# Patient Record
Sex: Female | Born: 2000 | Race: White | Hispanic: No | Marital: Single | Smoking: Never smoker
Health system: Southern US, Community
[De-identification: ages and names within clinical notes are randomized; demographics above are authoritative.]

## PROBLEM LIST (undated history)

## (undated) DIAGNOSIS — I10 Essential (primary) hypertension: Secondary | ICD-10-CM

## (undated) DIAGNOSIS — E282 Polycystic ovarian syndrome: Secondary | ICD-10-CM

## (undated) DIAGNOSIS — G44229 Chronic tension-type headache, not intractable: Secondary | ICD-10-CM

## (undated) HISTORY — DX: Chronic tension-type headache, not intractable: G44.229

## (undated) HISTORY — PX: TONSILLECTOMY AND ADENOIDECTOMY: SUR1326

## (undated) HISTORY — DX: Essential (primary) hypertension: I10

## (undated) HISTORY — PX: MOUTH SURGERY: SHX715

## (undated) HISTORY — PX: LIPOMA RESECTION: SHX23

## (undated) HISTORY — DX: Polycystic ovarian syndrome: E28.2

---
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
## 2021-01-02 ENCOUNTER — Encounter: Payer: Self-pay | Admitting: Emergency Medicine

## 2021-01-02 ENCOUNTER — Emergency Department: Payer: 59

## 2021-01-02 ENCOUNTER — Other Ambulatory Visit: Payer: Self-pay

## 2021-01-02 ENCOUNTER — Emergency Department
Admission: EM | Admit: 2021-01-02 | Discharge: 2021-01-02 | Disposition: A | Discharge status: Home / Self Care | Payer: 59 | Attending: Emergency Medicine | Admitting: Emergency Medicine

## 2021-01-02 DIAGNOSIS — R110 Nausea: Secondary | ICD-10-CM | POA: Insufficient documentation

## 2021-01-02 DIAGNOSIS — R1032 Left lower quadrant pain: Secondary | ICD-10-CM | POA: Insufficient documentation

## 2021-01-02 DIAGNOSIS — R1012 Left upper quadrant pain: Secondary | ICD-10-CM | POA: Diagnosis present

## 2021-01-02 LAB — COMPREHENSIVE METABOLIC PANEL
ALT: 23 U/L (ref 0–44)
AST: 19 U/L (ref 15–41)
Albumin: 4.4 g/dL (ref 3.5–5.0)
Alkaline Phosphatase: 42 U/L (ref 38–126)
Anion gap: 6 (ref 5–15)
BUN: 10 mg/dL (ref 6–20)
CO2: 25 mmol/L (ref 22–32)
Calcium: 9.2 mg/dL (ref 8.9–10.3)
Chloride: 102 mmol/L (ref 98–111)
Creatinine, Ser: 0.65 mg/dL (ref 0.44–1.00)
GFR, Estimated: >60 mL/min (ref >60)
Glucose, Bld: 123 mg/dL — ABNORMAL HIGH (ref 70–99)
Potassium: 3.9 mmol/L (ref 3.5–5.1)
Sodium: 133 mmol/L — ABNORMAL LOW (ref 135–145)
Total Bilirubin: 0.7 mg/dL (ref 0.3–1.2)
Total Protein: 7.4 g/dL (ref 6.5–8.1)

## 2021-01-02 LAB — URINALYSIS, COMPLETE (UACMP) WITH MICROSCOPIC
Bacteria, UA: NONE SEEN
Bilirubin Urine: NEGATIVE
Glucose, UA: NEGATIVE mg/dL
Hgb urine dipstick: NEGATIVE
Ketones, ur: NEGATIVE mg/dL
Leukocytes,Ua: NEGATIVE
Nitrite: NEGATIVE
Protein, ur: NEGATIVE mg/dL
Specific Gravity, Urine: 1.021 (ref 1.005–1.030)
pH: 6 (ref 5.0–8.0)

## 2021-01-02 LAB — CBC
HCT: 41.5 % (ref 36.0–46.0)
Hemoglobin: 14.6 g/dL (ref 12.0–15.0)
MCH: 29.1 pg (ref 26.0–34.0)
MCHC: 35.2 g/dL (ref 30.0–36.0)
MCV: 82.8 fL (ref 80.0–100.0)
Platelets: 272 10*3/uL (ref 150–400)
RBC: 5.01 MIL/uL (ref 3.87–5.11)
RDW: 12.5 % (ref 11.5–15.5)
WBC: 7.4 10*3/uL (ref 4.0–10.5)
nRBC: 0 % (ref 0.0–0.2)

## 2021-01-02 LAB — LIPASE, BLOOD: Lipase: 29 U/L (ref 11–51)

## 2021-01-02 LAB — POC URINE PREG, ED: Preg Test, Ur: NEGATIVE

## 2021-01-02 MED ORDER — TRAMADOL HCL 50 MG PO TABS: 50.0000 mg | ORAL_TABLET | Freq: Four times a day (QID) | ORAL | 0 refills | Status: DC | PRN

## 2021-01-02 NOTE — ED Provider Notes (Signed)
[facility] Emergency Department Provider Note   ____________________________________________    I have reviewed the triage vital signs and the nursing notes.   HISTORY  Chief Complaint Abdominal Pain and Nausea     HPI Valerie Hayes is a 20 y.o. female who presents with complaints of abdominal pain and nausea.  Patient reports left upper quadrant abdominal pain that has been ongoing for approximately 24 to 48 hours.  She reports he went to urgent care today and when she gave a urine sample the pain seemed to move more suprapubically but now it is primarily in the left upper quadrant, she describes mild nausea.  She is noted significant odor to her urine over the last week as well.  No history of kidney stones.  Denies fever but occasional chills  History reviewed. No pertinent past medical history.  There are no problems to display for this patient.   History reviewed. No pertinent surgical history.  Prior to Admission medications   Medication Sig Start Date End Date Taking? Authorizing Provider  traMADol (ULTRAM) 50 MG tablet Take 1 tablet (50 mg total) by mouth every 6 (six) hours as needed. 01/02/21 01/02/22 Yes Jene Every, MD     Allergies Patient has no known allergies.  No family history on file.  Social History    Review of Systems  Constitutional: As above Eyes: No visual changes.  ENT: No sore throat. Cardiovascular: Denies chest pain. Respiratory: Denies shortness of breath. Gastrointestinal: As above Genitourinary: No dysuria Musculoskeletal: Negative for back pain. Skin: Negative for rash. Neurological: Negative for headaches or weakness   ____________________________________________   PHYSICAL EXAM:  VITAL SIGNS: ED Triage Vitals  Enc Vitals Group     BP 01/02/21 0956 128/90     Pulse Rate 01/02/21 0956 93     Resp 01/02/21 0956 17     Temp 01/02/21 0956 98.4 F (36.9 C)     Temp Source 01/02/21 0956  Oral     SpO2 01/02/21 0956 98 %     Weight 01/02/21 0956 93 kg (205 lb)     Height 01/02/21 0956 1.626 m (5\' 4" )     Head Circumference --      Peak Flow --      Pain Score 01/02/21 0956 8     Pain Loc --      Pain Edu? --      Excl. in GC? --     Constitutional: Alert and oriented. Eyes: Conjunctivae are normal.   Nose: No congestion/rhinnorhea.  Cardiovascular: Normal rate, regular rhythm. Grossly normal heart sounds.  Good peripheral circulation. Respiratory: Normal respiratory effort.  No retractions. Lungs CTAB. Gastrointestinal: Soft, mild left l upper quadrant abdominal tenderness no significant CVA tenderness  Musculoskeletal: No lower extremity tenderness nor edema.  Warm and well perfused Neurologic:  Normal speech and language. No gross focal neurologic deficits are appreciated.  Skin:  Skin is warm, dry and intact. No rash noted. Psychiatric: Mood and affect are normal. Speech and behavior are normal.  ____________________________________________   LABS (all labs ordered are listed, but only abnormal results are displayed)  Labs Reviewed  COMPREHENSIVE METABOLIC PANEL - Abnormal; Notable for the following components:      Result Value   Sodium 133 (*)    Glucose, Bld 123 (*)    All other components within normal limits  URINALYSIS, COMPLETE (UACMP) WITH MICROSCOPIC - Abnormal; Notable for the following components:   Color, Urine YELLOW (*)  APPearance CLEAR (*)    All other components within normal limits  LIPASE, BLOOD  CBC  POC URINE PREG, ED   ____________________________________________  EKG  None ____________________________________________  RADIOLOGY  CT renal stone study reviewed by me ____________________________________________   PROCEDURES  Procedure(s) performed: No  Procedures   Critical Care performed: No ____________________________________________   INITIAL IMPRESSION / ASSESSMENT AND PLAN / ED COURSE  Pertinent labs  & imaging results that were available during my care of the patient were reviewed by me and considered in my medical decision making (see chart for details).   Patient presents with complaints as noted above.  Dark foul-smelling urine and suprapubic discomfort suspicious for possible urinary tract infection, overall patient is well-appearing with normal vitals, lab work overall unremarkable.  Normal LFTs.  Normal white blood cell count.  Pending urinalysis, POCT pregnancy.  Urinalysis is unremarkable, sent for CT renal stone study to evaluate for ureterolithiasis/pyelonephritis  CT is reassuring.  Recommended ultrasound of the pelvis however patient declined as she notes that she had an ultrasound done 2 weeks ago which was unremarkable.  Will discharge home on analgesics, return precautions discussed    ____________________________________________   FINAL CLINICAL IMPRESSION(S) / ED DIAGNOSES  Final diagnoses:  Left lower quadrant abdominal pain        Note:  This document was prepared using Dragon voice recognition software and may include unintentional dictation errors.    Jene Every, MD 01/02/21 1435

## 2021-01-02 NOTE — ED Notes (Signed)
See triage note  Presents with some abd pain  States pain is mainly to LUQ   Pos nausea  No vomiting or fever

## 2021-01-02 NOTE — ED Triage Notes (Signed)
Pt reports yesterday started feeling nauseated and today started with left upper quad abd pain that is sharp in nature

## 2021-01-08 ENCOUNTER — Ambulatory Visit: Payer: 59 | Admitting: Surgery

## 2021-01-19 ENCOUNTER — Ambulatory Visit: Payer: Self-pay | Admitting: Cardiology

## 2021-06-02 ENCOUNTER — Telehealth: Payer: Self-pay | Admitting: Nurse Practitioner

## 2021-06-02 DIAGNOSIS — U071 COVID-19: Secondary | ICD-10-CM

## 2021-06-02 MED ORDER — MOLNUPIRAVIR EUA 200MG CAPSULE: 4.0000 | ORAL_CAPSULE | Freq: Two times a day (BID) | ORAL | 0 refills | Status: AC

## 2021-06-02 NOTE — Patient Instructions (Signed)
COVID-19: Quarantine and Isolation °Quarantine °If you were exposed °Quarantine and stay away from others when you have been in close contact with someone who has COVID-19. °Isolate °If you are sick or test positive °Isolate when you are sick or when you have COVID-19, even if you don't have symptoms. °When to stay home °Calculating quarantine °The date of your exposure is considered day 0. Day 1 is the first full day after your last contact with a person who has had COVID-19. Stay home and away from other people for at least 5 days. Learn why CDC updated guidance for the general public. °IF YOU were exposed to COVID-19 and are NOT  °up to dateIF YOU were exposed to COVID-19 and are NOT on COVID-19 vaccinations °Quarantine for at least 5 days °Stay home °Stay home and quarantine for at least 5 full days. °Wear a well-fitting mask if you must be around others in your home. °Do not travel. °Get tested °Even if you don't develop symptoms, get tested at least 5 days after you last had close contact with someone with COVID-19. °After quarantine °Watch for symptoms °Watch for symptoms until 10 days after you last had close contact with someone with COVID-19. °Avoid travel °It is best to avoid travel until a full 10 days after you last had close contact with someone with COVID-19. °If you develop symptoms °Isolate immediately and get tested. Continue to stay home until you know the results. Wear a well-fitting mask around others. °Take precautions until day 10 °Wear a well-fitting mask °Wear a well-fitting mask for 10 full days any time you are around others inside your home or in public. Do not go to places where you are unable to wear a well-fitting mask. °If you must travel during days 6-10, take precautions. °Avoid being around people who are more likely to get very sick from COVID-19. °IF YOU were exposed to COVID-19 and are  °up to dateIF YOU were exposed to COVID-19 and are on COVID-19 vaccinations °No  quarantine °You do not need to stay home unless you develop symptoms. °Get tested °Even if you don't develop symptoms, get tested at least 5 days after you last had close contact with someone with COVID-19. °Watch for symptoms °Watch for symptoms until 10 days after you last had close contact with someone with COVID-19. °If you develop symptoms °Isolate immediately and get tested. Continue to stay home until you know the results. Wear a well-fitting mask around others. °Take precautions until day 10 °Wear a well-fitting mask °Wear a well-fitting mask for 10 full days any time you are around others inside your home or in public. Do not go to places where you are unable to wear a well-fitting mask. °Take precautions if traveling °Avoid being around people who are more likely to get very sick from COVID-19. °IF YOU were exposed to COVID-19 and had confirmed COVID-19 within the past 90 days (you tested positive using a viral test) °No quarantine °You do not need to stay home unless you develop symptoms. °Watch for symptoms °Watch for symptoms until 10 days after you last had close contact with someone with COVID-19. °If you develop symptoms °Isolate immediately and get tested. Continue to stay home until you know the results. Wear a well-fitting mask around others. °Take precautions until day 10 °Wear a well-fitting mask °Wear a well-fitting mask for 10 full days any time you are around others inside your home or in public. Do not go to places where you are   unable to wear a well-fitting mask. °Take precautions if traveling °Avoid being around people who are more likely to get very sick from COVID-19. °Calculating isolation °Day 0 is your first day of symptoms or a positive viral test. Day 1 is the first full day after your symptoms developed or your test specimen was collected. If you have COVID-19 or have symptoms, isolate for at least 5 days. °IF YOU tested positive for COVID-19 or have symptoms, regardless of  vaccination status °Stay home for at least 5 days °Stay home for 5 days and isolate from others in your home. °Wear a well-fitting mask if you must be around others in your home. °Do not travel. °Ending isolation if you had symptoms °End isolation after 5 full days if you are fever-free for 24 hours (without the use of fever-reducing medication) and your symptoms are improving. °Ending isolation if you did NOT have symptoms °End isolation after at least 5 full days after your positive test. °If you got very sick from COVID-19 or have a weakened immune system °You should isolate for at least 10 days. Consult your doctor before ending isolation. °Take precautions until day 10 °Wear a well-fitting mask °Wear a well-fitting mask for 10 full days any time you are around others inside your home or in public. Do not go to places where you are unable to wear a well-fitting mask. °Do not travel °Do not travel until a full 10 days after your symptoms started or the date your positive test was taken if you had no symptoms. °Avoid being around people who are more likely to get very sick from COVID-19. °Definitions °Exposure °Contact with someone infected with SARS-CoV-2, the virus that causes COVID-19, in a way that increases the likelihood of getting infected with the virus. °Close contact °A close contact is someone who was less than 6 feet away from an infected person (laboratory-confirmed or a clinical diagnosis) for a cumulative total of 15 minutes or more over a 24-hour period. For example, three individual 5-minute exposures for a total of 15 minutes. People who are exposed to someone with COVID-19 after they completed at least 5 days of isolation are not considered close contacts. °Quarantine °Quarantine is a strategy used to prevent transmission of COVID-19 by keeping people who have been in close contact with someone with COVID-19 apart from others. °Who does not need to quarantine? °If you had close contact with  someone with COVID-19 and you are in one of the following groups, you do not need to quarantine. °You are up to date with your COVID-19 vaccines. °You had confirmed COVID-19 within the last 90 days (meaning you tested positive using a viral test). °If you are up to date with COVID-19 vaccines, you should wear a well-fitting mask around others for 10 days from the date of your last close contact with someone with COVID-19 (the date of last close contact is considered day 0). Get tested at least 5 days after you last had close contact with someone with COVID-19. If you test positive or develop COVID-19 symptoms, isolate from other people and follow recommendations in the Isolation section below. If you tested positive for COVID-19 with a viral test within the previous 90 days and subsequently recovered and remain without COVID-19 symptoms, you do not need to quarantine or get tested after close contact. You should wear a well-fitting mask around others for 10 days from the date of your last close contact with someone with COVID-19 (the date of last   close contact is considered day 0). If you have COVID-19 symptoms, get tested and isolate from other people and follow recommendations in the Isolation section below. °Who should quarantine? °If you come into close contact with someone with COVID-19, you should quarantine if you are not up to date on COVID-19 vaccines. This includes people who are not vaccinated. °What to do for quarantine °Stay home and away from other people for at least 5 days (day 0 through day 5) after your last contact with a person who has COVID-19. The date of your exposure is considered day 0. Wear a well-fitting mask when around others at home, if possible. °For 10 days after your last close contact with someone with COVID-19, watch for fever (100.4°F or greater), cough, shortness of breath, or other COVID-19 symptoms. °If you develop symptoms, get tested immediately and isolate until you receive  your test results. If you test positive, follow isolation recommendations. °If you do not develop symptoms, get tested at least 5 days after you last had close contact with someone with COVID-19. °If you test negative, you can leave your home, but continue to wear a well-fitting mask when around others at home and in public until 10 days after your last close contact with someone with COVID-19. °If you test positive, you should isolate for at least 5 days from the date of your positive test (if you do not have symptoms). If you do develop COVID-19 symptoms, isolate for at least 5 days from the date your symptoms began (the date the symptoms started is day 0). Follow recommendations in the isolation section below. °If you are unable to get a test 5 days after last close contact with someone with COVID-19, you can leave your home after day 5 if you have been without COVID-19 symptoms throughout the 5-day period. Wear a well-fitting mask for 10 days after your date of last close contact when around others at home and in public. °Avoid people who are have weakened immune systems or are more likely to get very sick from COVID-19, and nursing homes and other high-risk settings, until after at least 10 days. °If possible, stay away from people you live with, especially people who are at higher risk for getting very sick from COVID-19, as well as others outside your home throughout the full 10 days after your last close contact with someone with COVID-19. °If you are unable to quarantine, you should wear a well-fitting mask for 10 days when around others at home and in public. °If you are unable to wear a mask when around others, you should continue to quarantine for 10 days. Avoid people who have weakened immune systems or are more likely to get very sick from COVID-19, and nursing homes and other high-risk settings, until after at least 10 days. °See additional information about travel. °Do not go to places where you are  unable to wear a mask, such as restaurants and some gyms, and avoid eating around others at home and at work until after 10 days after your last close contact with someone with COVID-19. °After quarantine °Watch for symptoms until 10 days after your last close contact with someone with COVID-19. °If you have symptoms, isolate immediately and get tested. °Quarantine in high-risk congregate settings °In certain congregate settings that have high risk of secondary transmission (such as correctional and detention facilities, homeless shelters, or cruise ships), CDC recommends a 10-day quarantine for residents, regardless of vaccination and booster status. During periods of critical staffing   shortages, facilities may consider shortening the quarantine period for staff to ensure continuity of operations. Decisions to shorten quarantine in these settings should be made in consultation with state, local, tribal, or territorial health departments and should take into consideration the context and characteristics of the facility. CDC's setting-specific guidance provides additional recommendations for these settings. °Isolation °Isolation is used to separate people with confirmed or suspected COVID-19 from those without COVID-19. People who are in isolation should stay home until it's safe for them to be around others. At home, anyone sick or infected should separate from others, or wear a well-fitting mask when they need to be around others. People in isolation should stay in a specific "sick room" or area and use a separate bathroom if available. Everyone who has presumed or confirmed COVID-19 should stay home and isolate from other people for at least 5 full days (day 0 is the first day of symptoms or the date of the day of the positive viral test for asymptomatic persons). They should wear a mask when around others at home and in public for an additional 5 days. People who are confirmed to have COVID-19 or are showing  symptoms of COVID-19 need to isolate regardless of their vaccination status. This includes: °People who have a positive viral test for COVID-19, regardless of whether or not they have symptoms. °People with symptoms of COVID-19, including people who are awaiting test results or have not been tested. People with symptoms should isolate even if they do not know if they have been in close contact with someone with COVID-19. °What to do for isolation °Monitor your symptoms. If you have an emergency warning sign (including trouble breathing), seek emergency medical care immediately. °Stay in a separate room from other household members, if possible. °Use a separate bathroom, if possible. °Take steps to improve ventilation at home, if possible. °Avoid contact with other members of the household and pets. °Don't share personal household items, like cups, towels, and utensils. °Wear a well-fitting mask when you need to be around other people. °Learn more about what to do if you are sick and how to notify your contacts. °Ending isolation for people who had COVID-19 and had symptoms °If you had COVID-19 and had symptoms, isolate for at least 5 days. To calculate your 5-day isolation period, day 0 is your first day of symptoms. Day 1 is the first full day after your symptoms developed. You can leave isolation after 5 full days. °You can end isolation after 5 full days if you are fever-free for 24 hours without the use of fever-reducing medication and your other symptoms have improved (Loss of taste and smell may persist for weeks or months after recovery and need not delay the end of isolation). °You should continue to wear a well-fitting mask around others at home and in public for 5 additional days (day 6 through day 10) after the end of your 5-day isolation period. If you are unable to wear a mask when around others, you should continue to isolate for a full 10 days. Avoid people who have weakened immune systems or are more  likely to get very sick from COVID-19, and nursing homes and other high-risk settings, until after at least 10 days. °If you continue to have fever or your other symptoms have not improved after 5 days of isolation, you should wait to end your isolation until you are fever-free for 24 hours without the use of fever-reducing medication and your other symptoms have improved.   Continue to wear a well-fitting mask through day 10. Contact your healthcare provider if you have questions. °See additional information about travel. °Do not go to places where you are unable to wear a mask, such as restaurants and some gyms, and avoid eating around others at home and at work until a full 10 days after your first day of symptoms. °If an individual has access to a test and wants to test, the best approach is to use an antigen test1 towards the end of the 5-day isolation period. Collect the test sample only if you are fever-free for 24 hours without the use of fever-reducing medication and your other symptoms have improved (loss of taste and smell may persist for weeks or months after recovery and need not delay the end of isolation). If your test result is positive, you should continue to isolate until day 10. If your test result is negative, you can end isolation, but continue to wear a well-fitting mask around others at home and in public until day 10. Follow additional recommendations for masking and avoiding travel as described above. °1As noted in the labeling for authorized over-the counter antigen tests: Negative results should be treated as presumptive. Negative results do not rule out SARS-CoV-2 infection and should not be used as the sole basis for treatment or patient management decisions, including infection control decisions. To improve results, antigen tests should be used twice over a three-day period with at least 24 hours and no more than 48 hours between tests. °Note that these recommendations on ending isolation  do not apply to people who are moderately ill or very sick from COVID-19 or have weakened immune systems. See section below for recommendations for when to end isolation for these groups. °Ending isolation for people who tested positive for COVID-19 but had no symptoms °If you test positive for COVID-19 and never develop symptoms, isolate for at least 5 days. Day 0 is the day of your positive viral test (based on the date you were tested) and day 1 is the first full day after the specimen was collected for your positive test. You can leave isolation after 5 full days. °If you continue to have no symptoms, you can end isolation after at least 5 days. °You should continue to wear a well-fitting mask around others at home and in public until day 10 (day 6 through day 10). If you are unable to wear a mask when around others, you should continue to isolate for 10 days. Avoid people who have weakened immune systems or are more likely to get very sick from COVID-19, and nursing homes and other high-risk settings, until after at least 10 days. °If you develop symptoms after testing positive, your 5-day isolation period should start over. Day 0 is your first day of symptoms. Follow the recommendations above for ending isolation for people who had COVID-19 and had symptoms. °See additional information about travel. °Do not go to places where you are unable to wear a mask, such as restaurants and some gyms, and avoid eating around others at home and at work until 10 days after the day of your positive test. °If an individual has access to a test and wants to test, the best approach is to use an antigen test1 towards the end of the 5-day isolation period. If your test result is positive, you should continue to isolate until day 10. If your test result is positive, you can also choose to test daily and if your test result   is negative, you can end isolation, but continue to wear a well-fitting mask around others at home and in  public until day 10. Follow additional recommendations for masking and avoiding travel as described above. °1As noted in the labeling for authorized over-the counter antigen tests: Negative results should be treated as presumptive. Negative results do not rule out SARS-CoV-2 infection and should not be used as the sole basis for treatment or patient management decisions, including infection control decisions. To improve results, antigen tests should be used twice over a three-day period with at least 24 hours and no more than 48 hours between tests. °Ending isolation for people who were moderately or very sick from COVID-19 or have a weakened immune system °People who are moderately ill from COVID-19 (experiencing symptoms that affect the lungs like shortness of breath or difficulty breathing) should isolate for 10 days and follow all other isolation precautions. To calculate your 10-day isolation period, day 0 is your first day of symptoms. Day 1 is the first full day after your symptoms developed. If you are unsure if your symptoms are moderate, talk to a healthcare provider for further guidance. °People who are very sick from COVID-19 (this means people who were hospitalized or required intensive care or ventilation support) and people who have weakened immune systems might need to isolate at home longer. They may also require testing with a viral test to determine when they can be around others. CDC recommends an isolation period of at least 10 and up to 20 days for people who were very sick from COVID-19 and for people with weakened immune systems. Consult with your healthcare provider about when you can resume being around other people. If you are unsure if your symptoms are severe or if you have a weakened immune system, talk to a healthcare provider for further guidance. °People who have a weakened immune system should talk to their healthcare provider about the potential for reduced immune responses to  COVID-19 vaccines and the need to continue to follow current prevention measures (including wearing a well-fitting mask and avoiding crowds and poorly ventilated indoor spaces) to protect themselves against COVID-19 until advised otherwise by their healthcare provider. Close contacts of immunocompromised people--including household members--should also be encouraged to receive all recommended COVID-19 vaccine doses to help protect these people. °Isolation in high-risk congregate settings °In certain high-risk congregate settings that have high risk of secondary transmission and where it is not feasible to cohort people (such as correctional and detention facilities, homeless shelters, and cruise ships), CDC recommends a 10-day isolation period for residents. During periods of critical staffing shortages, facilities may consider shortening the isolation period for staff to ensure continuity of operations. Decisions to shorten isolation in these settings should be made in consultation with state, local, tribal, or territorial health departments and should take into consideration the context and characteristics of the facility. CDC's setting-specific guidance provides additional recommendations for these settings. °This CDC guidance is meant to supplement--not replace--any federal, state, local, territorial, or tribal health and safety laws, rules, and regulations. °Recommendations for specific settings °These recommendations do not apply to healthcare professionals. For guidance specific to these settings, see °Healthcare professionals: Interim Guidance for Managing Healthcare Personnel with SARS-CoV-2 Infection or Exposure to SARS-CoV-2 °Patients, residents, and visitors to healthcare settings: Interim Infection Prevention and Control Recommendations for Healthcare Personnel During the Coronavirus Disease 2019 (COVID-19) Pandemic °Additional setting-specific guidance and recommendations are available. °These  recommendations on quarantine and isolation do apply to K[employer] School   settings. Additional guidance is available here: Overview of COVID-19 Quarantine for K-12 Schools °Travelers: Travel information and recommendations °Congregate facilities and other settings: guidance pages for community, work, and school settings °Ongoing COVID-19 exposure FAQs °I live with someone with COVID-19, but I cannot be separated from them. How do we manage quarantine in this situation? °It is very important for people with COVID-19 to remain apart from other people, if possible, even if they are living together. If separation of the person with COVID-19 from others that they live with is not possible, the other people that they live with will have ongoing exposure, meaning they will be repeatedly exposed until that person is no longer able to spread the virus to other people. In this situation, there are precautions you can take to limit the spread of COVID-19: °The person with COVID-19 and everyone they live with should wear a well-fitting mask inside the home. °If possible, one person should care for the person with COVID-19 to limit the number of people who are in close contact with the infected person. °Take steps to protect yourself and others to reduce transmission in the home: °Quarantine if you are not up to date with your COVID-19 vaccines. °Isolate if you are sick or tested positive for COVID-19, even if you don't have symptoms. °Learn more about the public health recommendations for testing, mask use and quarantine of close contacts, like yourself, who have ongoing exposure. These recommendations differ depending on your vaccination status. °What should I do if I have ongoing exposure to COVID-19 from someone I live with? °Recommendations for this situation depend on your vaccination status: °If you are not up to date on COVID-19 vaccines and have ongoing exposure to COVID-19, you should: °Begin quarantine immediately and  continue to quarantine throughout the isolation period of the person with COVID-19. °Continue to quarantine for an additional 5 days starting the day after the end of isolation for the person with COVID-19. °Get tested at least 5 days after the end of isolation of the infected person that lives with them. °If you test negative, you can leave the home but should continue to wear a well-fitting mask when around others at home and in public until 10 days after the end of isolation for the person with COVID-19. °Isolate immediately if you develop symptoms of COVID-19 or test positive. °If you are up to date with COVID-19 vaccines and have ongoing exposure to COVID-19, you should: °Get tested at least 5 days after your first exposure. A person with COVID-19 is considered infectious starting 2 days before they develop symptoms, or 2 days before the date of their positive test if they do not have symptoms. °Get tested again at least 5 days after the end of isolation for the person with COVID-19. °Wear a well-fitting mask when you are around the person with COVID-19, and do this throughout their isolation period. °Wear a well-fitting mask around others for 10 days after the infected person's isolation period ends. °Isolate immediately if you develop symptoms of COVID-19 or test positive. °What should I do if multiple people I live with test positive for COVID-19 at different times? °Recommendations for this situation depend on your vaccination status: °If you are not up to date with your COVID-19 vaccines, you should: °Quarantine throughout the isolation period of any infected person that you live with. °Continue to quarantine until 5 days after the end of isolation date for the most recently infected person that lives with you. For example, if   the last day of isolation of the person most recently infected with COVID-19 was June 30, the new 5-day quarantine period starts on July 1. °Get tested at least 5 days after the end  of isolation for the most recently infected person that lives with you. °Wear a well-fitting mask when you are around any person with COVID-19 while that person is in isolation. °Wear a well-fitting mask when you are around other people until 10 days after your last close contact. °Isolate immediately if you develop symptoms of COVID-19 or test positive. °If you are up to date with your COVID-19 vaccines, you should: °Get tested at least 5 days after your first exposure. A person with COVID-19 is considered infectious starting 2 days before they developed symptoms, or 2 days before the date of their positive test if they do not have symptoms. °Get tested again at least 5 days after the end of isolation for the most recently infected person that lives with you. °Wear a well-fitting mask when you are around any person with COVID-19 while that person is in isolation. °Wear a well-fitting mask around others for 10 days after the end of isolation for the most recently infected person that lives with you. For example, if the last day of isolation for the person most recently infected with COVID-19 was June 30, the new 10-day period to wear a well-fitting mask indoors in public starts on July 1. °Isolate immediately if you develop symptoms of COVID-19 or test positive. °I had COVID-19 and completed isolation. Do I have to quarantine or get tested if someone I live with gets COVID-19 shortly after I completed isolation? °No. If you recently completed isolation and someone that lives with you tests positive for the virus that causes COVID-19 shortly after the end of your isolation period, you do not have to quarantine or get tested as long as you do not develop new symptoms. Once all of the people that live together have completed isolation or quarantine, refer to the guidance below for new exposures to COVID-19. °If you had COVID-19 in the previous 90 days and then came into close contact with someone with COVID-19, you do  not have to quarantine or get tested if you do not have symptoms. But you should: °Wear a well-fitting mask indoors in public for 10 days after your last close contact. °Monitor for COVID-19 symptoms for 10 days from the date of your last close contact. °Isolate immediately and get tested if symptoms develop. °If more than 90 days have passed since your recovery from infection, follow CDC's recommendations for close contacts. These recommendations will differ depending on your vaccination status. °08/16/2020 °Content source: National Center for Immunization and Respiratory Diseases (NCIRD), Division of Viral Diseases °This information is not intended to replace advice given to you by your health care provider. Make sure you discuss any questions you have with your health care provider. °Document Revised: 12/20/2020 Document Reviewed: 12/20/2020 °Elsevier Patient Education © 2022 [employer] Inc[employer] ° °

## 2021-06-02 NOTE — Progress Notes (Signed)
Virtual Visit Consent   Valerie Hayes, you are scheduled for a virtual visit with Mary-Margaret Daphine Deutscher, FNP, a [facility] provider, today.     Just as with appointments in the office, your consent must be obtained to participate.  Your consent will be active for this visit and any virtual visit you may have with one of our providers in the next 365 days.     If you have a MyChart account, a copy of this consent can be sent to you electronically.  All virtual visits are billed to your insurance company just like a traditional visit in the office.    As this is a virtual visit, video technology does not allow for your provider to perform a traditional examination.  This may limit your provider's ability to fully assess your condition.  If your provider identifies any concerns that need to be evaluated in person or the need to arrange testing (such as labs, EKG, etc.), we will make arrangements to do so.     Although advances in technology are sophisticated, we cannot ensure that it will always work on either your end or our end.  If the connection with a video visit is poor, the visit may have to be switched to a telephone visit.  With either a video or telephone visit, we are not always able to ensure that we have a secure connection.     I need to obtain your verbal consent now.   Are you willing to proceed with your visit today? YES   Valerie Hayes has provided verbal consent on 06/02/2021 for a virtual visit (video or telephone).   Mary-Margaret Daphine Deutscher, FNP   Date: 06/02/2021 9:09 AM   Virtual Visit via Video Note   I, Mary-Margaret Daphine Deutscher, connected with Valerie Hayes ([id number], September 12, 2000) on 06/02/21 at  9:15 AM EST by a video-enabled telemedicine application and verified that I am speaking with the correct person using two identifiers.  Location: Patient: Virtual Visit Location Patient: Home Provider: Virtual Visit Location Provider: Mobile   I discussed the  limitations of evaluation and management by telemedicine and the availability of in person appointments. The patient expressed understanding and agreed to proceed.    History of Present Illness: Valerie Hayes is a 21 y.o. who identifies as a female who was assigned female at birth, and is being seen today for covid positive.  HPI: URI  This is a new problem. The current episode started in the past 7 days. The problem has been gradually worsening. The maximum temperature recorded prior to her arrival was 100.4 - 100.9 F. The fever has been present for 1 to 2 days. Associated symptoms include congestion, coughing, headaches, rhinorrhea and a sore throat. Pertinent negatives include no chest pain. She has tried decongestant for the symptoms. The treatment provided mild relief.  Tested positive for covid this morning. Review of Systems  HENT:  Positive for congestion, rhinorrhea and sore throat.   Respiratory:  Positive for cough.   Cardiovascular:  Negative for chest pain.  Neurological:  Positive for headaches.   Problems: There are no problems to display for this patient.   Allergies: No Known Allergies Medications:  Current Outpatient Medications:    traMADol (ULTRAM) 50 MG tablet, Take 1 tablet (50 mg total) by mouth every 6 (six) hours as needed., Disp: 20 tablet, Rfl: 0  Observations/Objective: Patient is well-developed, well-nourished in no acute distress.  Resting comfortably  at home.  Head is normocephalic,  atraumatic.  No labored breathing.  Speech is clear and coherent with logical content.  Patient is alert and oriented at baseline.  Raspy voice  Assessment and Plan:  Valerie Hayes in today with chief complaint of Covid Positive   1. Lab test positive for detection of COVID-19 virus 1. Take meds as prescribed 2. Use a cool mist humidifier especially during the winter months and when heat has been humid. 3. Use saline nose sprays frequently 4. Saline irrigations  of the nose can be very helpful if done frequently.  * 4X daily for 1 week*  * Use of a nettie pot can be helpful with this. Follow directions with this* 5. Drink plenty of fluids 6. Keep thermostat turn down low 7.For any cough or congestion- delsym or mucinex 8. For fever or aces or pains- take tylenol or ibuprofen appropriate for age and weight.  * for fevers greater than 101 orally you may alternate ibuprofen and tylenol every  3 hours.    Meds ordered this encounter  Medications   molnupiravir EUA (LAGEVRIO) 200 mg CAPS capsule    Sig: Take 4 capsules (800 mg total) by mouth 2 (two) times daily for 5 days.    Dispense:  40 capsule    Refill:  0    Order Specific Question:   Supervising Provider    Answer:   Eber Hong [[id number]]        Follow Up Instructions: I discussed the assessment and treatment plan with the patient. The patient was provided an opportunity to ask questions and all were answered. The patient agreed with the plan and demonstrated an understanding of the instructions.  A copy of instructions were sent to the patient via MyChart.  The patient was advised to call back or seek an in-person evaluation if the symptoms worsen or if the condition fails to improve as anticipated.  Time:  I spent 8 minutes with the patient via telehealth technology discussing the above problems/concerns.    Mary-Margaret Daphine Deutscher, FNP

## 2021-06-03 ENCOUNTER — Telehealth: Payer: Self-pay | Admitting: Emergency Medicine

## 2021-06-03 DIAGNOSIS — J069 Acute upper respiratory infection, unspecified: Secondary | ICD-10-CM

## 2021-06-03 MED ORDER — PREDNISONE 20 MG PO TABS: 20.0000 mg | ORAL_TABLET | Freq: Two times a day (BID) | ORAL | 0 refills | Status: AC

## 2021-06-03 MED ORDER — LEVOCETIRIZINE DIHYDROCHLORIDE 5 MG PO TABS: 5.0000 mg | ORAL_TABLET | Freq: Every evening | ORAL | 0 refills | Status: DC

## 2021-06-03 NOTE — Progress Notes (Signed)
Virtual Visit Consent   Valerie Hayes, you are scheduled for a virtual visit with a [facility] provider today.     Just as with appointments in the office, your consent must be obtained to participate.  Your consent will be active for this visit and any virtual visit you may have with one of our providers in the next 365 days.     If you have a MyChart account, a copy of this consent can be sent to you electronically.  All virtual visits are billed to your insurance company just like a traditional visit in the office.    As this is a virtual visit, video technology does not allow for your provider to perform a traditional examination.  This may limit your provider's ability to fully assess your condition.  If your provider identifies any concerns that need to be evaluated in person or the need to arrange testing (such as labs, EKG, etc.), we will make arrangements to do so.     Although advances in technology are sophisticated, we cannot ensure that it will always work on either your end or our end.  If the connection with a video visit is poor, the visit may have to be switched to a telephone visit.  With either a video or telephone visit, we are not always able to ensure that we have a secure connection.     I need to obtain your verbal consent now.   Are you willing to proceed with your visit today? Yes   MARKALA SITTS has provided verbal consent on 06/03/2021 for a virtual visit (video or telephone).   Rennis Harding, [place]   Date: 06/03/2021 10:07 AM   Virtual Visit via Video Note   I, Rennis Harding, connected with  CATHARINA PICA  ([id number], May 01, 2001) on 06/03/21 at 10:00 AM EST by a video-enabled telemedicine application and verified that I am speaking with the correct person using two identifiers.  Location: Patient: Virtual Visit Location Patient: Home Provider: Virtual Visit Location Provider: Home Office   I discussed the limitations of evaluation and management  by telemedicine and the availability of in person appointments. The patient expressed understanding and agreed to proceed.    History of Present Illness: Valerie Hayes is a 21 y.o. who identifies as a female who was assigned female at birth, and is being seen today for sinus congestion with green drainage x 2 days  Reports covid and flu exposure at work.  Had negative covid test.  Has tried OTC medications with minimal relief.  Denies aggravating factors.  Reports previous symptoms in the past with sinus infection.   Complains of associated fatigue and body aches.  Denies fever, SOB, wheezing, chest pain, nausea, changes in bowel or bladder habits.    ROS: As per HPI.  All other pertinent ROS negative.     HPI: HPI  Problems: There are no problems to display for this patient.   Allergies: No Known Allergies Medications:  Current Outpatient Medications:    levocetirizine (XYZAL ALLERGY 24HR) 5 MG tablet, Take 1 tablet (5 mg total) by mouth every evening., Disp: 30 tablet, Rfl: 0   predniSONE (DELTASONE) 20 MG tablet, Take 1 tablet (20 mg total) by mouth 2 (two) times daily with a meal for 5 days., Disp: 10 tablet, Rfl: 0   molnupiravir EUA (LAGEVRIO) 200 mg CAPS capsule, Take 4 capsules (800 mg total) by mouth 2 (two) times daily for 5 days., Disp: 40 capsule, Rfl: 0  traMADol (ULTRAM) 50 MG tablet, Take 1 tablet (50 mg total) by mouth every 6 (six) hours as needed., Disp: 20 tablet, Rfl: 0  Observations/Objective: Patient is well-developed, well-nourished in no acute distress.  Resting comfortably at home. Appears fatigued but nontoxic Head is normocephalic, atraumatic.  No labored breathing. Speaking in full sentences and tolerating own secretions Speech is clear and coherent with logical content.  Patient is alert and oriented at baseline.   Assessment and Plan: 1. Viral URI with cough   Symptoms sound viral Get plenty of rest and push fluids Prednisone prescribed.  Take as  directed  Prescribed xyzal  for nasal congestion, runny nose, and/or sore throat Use flonase for nasal congestion and runny nose Use medications daily for symptom relief Use OTC medications like ibuprofen or tylenol as needed fever or pain Follow up with PCP as needed Follow up in person at urgent care or the ED if you have any new or worsening symptoms such as fever, worsening cough, shortness of breath, chest tightness, chest pain, turning blue, changes in mental status, etc...    Follow Up Instructions: I discussed the assessment and treatment plan with the patient. The patient was provided an opportunity to ask questions and all were answered. The patient agreed with the plan and demonstrated an understanding of the instructions.  A copy of instructions were sent to the patient via MyChart unless otherwise noted below.   The patient was advised to call back or seek an in-person evaluation if the symptoms worsen or if the condition fails to improve as anticipated.  Time:  I spent 5-10 minutes with the patient via telehealth technology discussing the above problems/concerns.    Rennis Harding, PA-C

## 2021-06-03 NOTE — Patient Instructions (Signed)
°  Valerie Hayes, thank you for joining Rennis Harding, PA-C for today's virtual visit.  While this provider is not your primary care provider (PCP), if your PCP is located in our provider database this encounter information will be shared with them immediately following your visit.  Consent: (Patient) Valerie Hayes provided verbal consent for this virtual visit at the beginning of the encounter.  Current Medications:  Current Outpatient Medications:    levocetirizine (XYZAL ALLERGY 24HR) 5 MG tablet, Take 1 tablet (5 mg total) by mouth every evening., Disp: 30 tablet, Rfl: 0   predniSONE (DELTASONE) 20 MG tablet, Take 1 tablet (20 mg total) by mouth 2 (two) times daily with a meal for 5 days., Disp: 10 tablet, Rfl: 0   molnupiravir EUA (LAGEVRIO) 200 mg CAPS capsule, Take 4 capsules (800 mg total) by mouth 2 (two) times daily for 5 days., Disp: 40 capsule, Rfl: 0   traMADol (ULTRAM) 50 MG tablet, Take 1 tablet (50 mg total) by mouth every 6 (six) hours as needed., Disp: 20 tablet, Rfl: 0   Medications ordered in this encounter:  Meds ordered this encounter  Medications   predniSONE (DELTASONE) 20 MG tablet    Sig: Take 1 tablet (20 mg total) by mouth 2 (two) times daily with a meal for 5 days.    Dispense:  10 tablet    Refill:  0    Order Specific Question:   Supervising Provider    Answer:   Hyacinth Meeker, BRIAN [[id number]]   levocetirizine (XYZAL ALLERGY 24HR) 5 MG tablet    Sig: Take 1 tablet (5 mg total) by mouth every evening.    Dispense:  30 tablet    Refill:  0    Order Specific Question:   Supervising Provider    Answer:   Hyacinth Meeker, BRIAN [[id number]]     *If you need refills on other medications prior to your next appointment, please contact your pharmacy*  Follow-Up: Call back or seek an in-person evaluation if the symptoms worsen or if the condition fails to improve as anticipated.  Other Instructions Symptoms sound viral Get plenty of rest and push fluids Prednisone  prescribed.  Take as directed  Prescribed xyzal  for nasal congestion, runny nose, and/or sore throat Use flonase for nasal congestion and runny nose Use medications daily for symptom relief Use OTC medications like ibuprofen or tylenol as needed fever or pain Follow up with PCP as needed Follow up in person at urgent care or the ED if you have any new or worsening symptoms such as fever, worsening cough, shortness of breath, chest tightness, chest pain, turning blue, changes in mental status, etc...     If you have been instructed to have an in-person evaluation today at a local Urgent Care facility, please use the link below. It will take you to a list of all of our available [facility], including address, phone number and hours of operation. Please do not delay care.  [facility]  If you or a family member do not have a primary care provider, use the link below to schedule a visit and establish care. When you choose a [facility] primary care physician or advanced practice provider, you gain a long-term partner in health. Find a Primary Care Provider  Learn more about [facility]'s in-office and virtual care options: [facility] - Get Care Now

## 2021-07-30 ENCOUNTER — Encounter: Payer: Self-pay | Admitting: Family

## 2021-07-30 ENCOUNTER — Other Ambulatory Visit: Payer: Self-pay

## 2021-07-30 ENCOUNTER — Ambulatory Visit (INDEPENDENT_AMBULATORY_CARE_PROVIDER_SITE_OTHER): Payer: Self-pay | Admitting: Family

## 2021-07-30 VITALS — BP 118/64 | HR 89 | Ht 63.0 in | Wt 197.0 lb

## 2021-07-30 DIAGNOSIS — M62838 Other muscle spasm: Secondary | ICD-10-CM | POA: Insufficient documentation

## 2021-07-30 DIAGNOSIS — E282 Polycystic ovarian syndrome: Secondary | ICD-10-CM | POA: Insufficient documentation

## 2021-07-30 DIAGNOSIS — N926 Irregular menstruation, unspecified: Secondary | ICD-10-CM

## 2021-07-30 DIAGNOSIS — R5383 Other fatigue: Secondary | ICD-10-CM

## 2021-07-30 DIAGNOSIS — F418 Other specified anxiety disorders: Secondary | ICD-10-CM

## 2021-07-30 DIAGNOSIS — G43109 Migraine with aura, not intractable, without status migrainosus: Secondary | ICD-10-CM

## 2021-07-30 DIAGNOSIS — F5105 Insomnia due to other mental disorder: Secondary | ICD-10-CM

## 2021-07-30 LAB — POCT URINE PREGNANCY: Preg Test, Ur: NEGATIVE

## 2021-07-30 MED ORDER — METHOCARBAMOL 500 MG PO TABS: ORAL_TABLET | ORAL | 0 refills | Status: DC

## 2021-07-30 MED ORDER — SERTRALINE HCL 50 MG PO TABS: 50.0000 mg | ORAL_TABLET | Freq: Every day | ORAL | 2 refills | Status: DC

## 2021-07-30 NOTE — Patient Instructions (Signed)
Start sertraline (Zoloft) 50 mg for anxiety and depression. Take 1/2 tablet by mouth once daily for about one week, then increase to 1 full tablet thereafter.  ? ?Taking the medicine as directed and not missing any doses is one of the best things you can do to treat youranxiety/depression.  Here are some things to keep in mind: ? ?Side effects (stomach upset, some increased anxiety) may happen before you notice a benefit.  These side effects typically go away over time. ?Changes to your dose of medicine or a change in medication all together is sometimes necessary ?Many people will notice an improvement within two weeks but the full effect of the medication can take up to 4-6 weeks ?Stopping the medication when you start feeling better often results in a return of symptoms. Most people need to be on medication at least 6-12 months ?If you start having thoughts of hurting yourself or others after starting this medicine, please call me immediately.   ? ?Stop by the lab prior to leaving today. I will notify you of your results once received.  ? ?It was a pleasure seeing you today! Please do not hesitate to reach out with any questions and or concerns. ? ?Regards,  ? ?Shemicka Cohrs ?FNP-C ? ?

## 2021-07-30 NOTE — Assessment & Plan Note (Signed)
R/o pregnancy with poct hcg ?

## 2021-07-30 NOTE — Assessment & Plan Note (Addendum)
D/w pt options however pt possibly trying to conceive. We will obtain labs to include tsh, cbc, cmp and sed rate/crp. Pending results. Thought is to help anxiety/depression to see if improvement in migraines. Increase water intake. Keep headache diary, try to avoid/identify triggers.  ? ?If no improvement consider higher dose topamax, and or referral neurology ?

## 2021-07-30 NOTE — Assessment & Plan Note (Signed)
tsh cbc cmp ordered pending results ?

## 2021-07-30 NOTE — Progress Notes (Signed)
New Patient Office Visit  Subjective:  Patient ID: Valerie Hayes, female    DOB: [birth year]  Age: 21 y.o. MRN: [id number]  CC:  Chief Complaint  Patient presents with   Migraine    Pt stated --had migraine and lasted for 1 day.   Anxiety    HPI Valerie Hayes is here to establish care as a new patient.  Prior provider was: last saw pediatrician back in 2017.  Pt is without acute concerns.   chronic concerns:  Headaches in the past: was taking topamax 25 mg, didn't really help her.  MRI 03/01/2017, FINDINGS:   Susceptibility artifact from dental braces limits evaluation of the anterior brain, face, and skull base.There is no focal parenchymal signal abnormality. There is no midline shift or mass lesion. There is no evidence of intracranial hemorrhage or acute infarct.  No diffusion weighted signal abnormality is identified.  Valerie Hayes has tried several OTC pain medications for her headaches: the most effective has been Excedrin migraine. She has also had some relief with ibuprofen 600 mg. She previously saw my colleague, Dr. Eula Listen, and had a trial of topiramate 25 mg. Valerie Hayes took 25 mg once a day but noted no significant change in her symptoms. I noted that he recommended titrating up to a goal of 50 mg BID but this does not seem to have happened. Valerie Hayes has also tried Maxalt, [facility], [facility], and a medrol dose pack for headaches, none of which seemed to be effective for her.   States migraines are frequent, about 2-3 times weekly. Increased stress, just bought a house. 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 When they come on feels tinging in arm, usually always on left side. Some nausea. No vomiting. No diarrhea. Light sensitive , not audio sensitive. No loss of movement or limb function. Right now tries excedrin migraine but only touches it slightly. Lasts about one day at a time, has to sleep it off. She is trying to drink a lot of water and  cut out sodas completely.   PCOS: irregularly irregular. Last1/22/23. Not sure if any chance of pregnancy. Painful heavy periods when they do occur, two weeks at a time. Pad and tampon both in one hour. No longer on birth control.   Anxiety: increased stress, on edge being told she snaps a lot more often than usual. Having issues falling asleep because hard to turn her brain off. Also with some depressive thoughts. Denies SI HI . Dad is taking zoloft as well.   GAD 7 : Generalized Anxiety Score 07/30/2021  Nervous, Anxious, on Edge 2  Control/stop worrying 3  Worry too much - different things 3  Trouble relaxing 2  Restless 2  Easily annoyed or irritable 3  Afraid - awful might happen 1  Total GAD 7 Score 16  Anxiety Difficulty Very difficult    PHQ9 SCORE ONLY 07/30/2021  PHQ-9 Total Score 19     History reviewed. No pertinent past medical history.  Past Surgical History:  Procedure Laterality Date   LIPOMA RESECTION     MOUTH SURGERY     expose a teeth that would'nt come down   TONSILLECTOMY AND ADENOIDECTOMY      No family history on file.  Social History   Socioeconomic History   Marital status: Single    Spouse name: Not on file   Number of children: Not on file   Years of education: Not on file  Highest education level: Not on file  Occupational History   Occupation: nursing student  Tobacco Use   Smoking status: Never   Smokeless tobacco: Never  Vaping Use   Vaping Use: Not on file  Substance and Sexual Activity   Alcohol use: Never   Drug use: Never   Sexual activity: Yes    Partners: Male    Birth control/protection: Coitus interruptus  Other Topics Concern   Not on file  Social History Narrative   Not on file   Social Determinants of Health   Financial Resource Strain: Not on file  Food Insecurity: Not on file  Transportation Needs: Not on file  Physical Activity: Not on file  Stress: Not on file  Social Connections: Not on file  Intimate  Partner Violence: Not on file    Outpatient Medications Prior to Visit  Medication Sig Dispense Refill   levocetirizine (XYZAL ALLERGY 24HR) 5 MG tablet Take 1 tablet (5 mg total) by mouth every evening. 30 tablet 0   traMADol (ULTRAM) 50 MG tablet Take 1 tablet (50 mg total) by mouth every 6 (six) hours as needed. 20 tablet 0   No facility-administered medications prior to visit.    Allergies  Allergen Reactions   Fexofenadine-Pseudoephed Er     ROS Review of Systems  Constitutional:  Negative for chills, fatigue, fever and unexpected weight change.  HENT:         Hear on chin and side of face  Eyes:  Negative for visual disturbance.  Respiratory:  Negative for shortness of breath.   Cardiovascular:  Negative for chest pain.  Gastrointestinal:  Negative for abdominal pain.  Genitourinary:  Positive for menstrual problem. Negative for difficulty urinating.  Skin:  Negative for rash.  Neurological:  Negative for dizziness and headaches.  Psychiatric/Behavioral:  Positive for agitation and sleep disturbance. Negative for behavioral problems, confusion, dysphoric mood and suicidal ideas. The patient is nervous/anxious.       Objective:    Physical Exam Vitals reviewed.  Constitutional:      General: She is not in acute distress.    Appearance: Normal appearance. She is obese. She is not ill-appearing, toxic-appearing or diaphoretic.  HENT:     Right Ear: Tympanic membrane normal.     Left Ear: Tympanic membrane normal.     Mouth/Throat:     Mouth: Mucous membranes are moist.     Pharynx: No pharyngeal swelling.     Tonsils: No tonsillar exudate.  Eyes:     Extraocular Movements: Extraocular movements intact.     Conjunctiva/sclera: Conjunctivae normal.     Pupils: Pupils are equal, round, and reactive to light.  Neck:     Thyroid: No thyroid mass.   Cardiovascular:     Rate and Rhythm: Normal rate and regular rhythm.  Pulmonary:     Effort: Pulmonary effort is  normal.     Breath sounds: Normal breath sounds.  Abdominal:     General: Abdomen is flat. Bowel sounds are normal.     Palpations: Abdomen is soft.  Musculoskeletal:        General: Normal range of motion.  Lymphadenopathy:     Cervical:     Right cervical: No superficial cervical adenopathy.    Left cervical: No superficial cervical adenopathy.  Skin:    General: Skin is warm.     Capillary Refill: Capillary refill takes less than 2 seconds.  Neurological:     General: No focal deficit present.  Mental Status: She is alert and oriented to person, place, and time.  Psychiatric:        Mood and Affect: Mood normal.        Behavior: Behavior normal.        Thought Content: Thought content normal.        Judgment: Judgment normal.      BP 118/64    Pulse 89    Ht  (1.6 m)    Wt 197 lb (89.4 kg)    LMP 06/10/2021    SpO2 98%    BMI 34.90 kg/m  Wt Readings from Last 3 Encounters:  07/30/21 197 lb (89.4 kg)  01/02/21 205 lb (93 kg) (98 %, Z= 2.02)*  11/21/20 200 lb 6.4 oz (90.9 kg) (97 %, Z= 1.95)*   * Growth percentiles are based on CDC (Girls, 2-20 Years) data.     Health Maintenance Due  Topic Date Due   COVID-19 Vaccine (1) Never done   CHLAMYDIA SCREENING  Never done   HIV Screening  Never done   Hepatitis C Screening  Never done   INFLUENZA VACCINE  12/18/2020    There are no preventive care reminders to display for this patient.  Lab Results  Component Value Date   TSH 0.739 11/21/2020   Lab Results  Component Value Date   WBC 7.4 01/02/2021   HGB 14.6 01/02/2021   HCT 41.5 01/02/2021   MCV 82.8 01/02/2021   PLT 272 01/02/2021   Lab Results  Component Value Date   NA 133 (L) 01/02/2021   K 3.9 01/02/2021   CO2 25 01/02/2021   GLUCOSE 123 (H) 01/02/2021   BUN 10 01/02/2021   CREATININE 0.65 01/02/2021   BILITOT 0.7 01/02/2021   ALKPHOS 42 01/02/2021   AST 19 01/02/2021   ALT 23 01/02/2021   PROT 7.4 01/02/2021   ALBUMIN 4.4 01/02/2021    CALCIUM 9.2 01/02/2021   ANIONGAP 6 01/02/2021   No results found for: CHOL No results found for: HDL No results found for: LDLCALC No results found for: TRIG No results found for: CHOLHDL Lab Results  Component Value Date   HGBA1C 5.5 11/21/2020      Assessment & Plan:   Problem List Items Addressed This Visit       Cardiovascular and Mediastinum   Migraine with aura and without status migrainosus, not intractable - Primary    D/w pt options however pt possibly trying to conceive. We will obtain labs to include tsh, cbc, cmp and sed rate/crp. Pending results. Thought is to help anxiety/depression to see if improvement in migraines. Increase water intake. Keep headache diary, try to avoid/identify triggers.   If no improvement consider higher dose topamax, and or referral neurology      Relevant Medications   sertraline (ZOLOFT) 50 MG tablet   methocarbamol (ROBAXIN) 500 MG tablet   Other Relevant Orders   TSH   CBC   Basic metabolic panel   Sedimentation rate   C-reactive protein     Endocrine   PCOS (polycystic ovarian syndrome)     Other   Insomnia secondary to depression with anxiety    Trial start zoloft.   I instructed pt to start 1/2 tablet once daily for 1 week and then increase to a full tablet once daily on week two as tolerated.  We discussed common side effects such as nausea, drowsiness and weight gain.  Also discussed rare but serious side effect of suicidal ideation.  She is instructed to discontinue medication and go directly to ED if this occurs.  Pt verbalizes understanding.  Plan is to follow up in 30 days to evaluate progress.          Relevant Medications   sertraline (ZOLOFT) 50 MG tablet   Missed period    R/o pregnancy with poct hcg      Relevant Orders   POCT urine pregnancy   Other fatigue    tsh cbc cmp ordered pending results      Relevant Orders   TSH   CBC   Basic metabolic panel   Sedimentation rate   C-reactive protein    Muscle spasm    Tightness of the lower back neck D/w pt posture, exercises, relaxation, applying heat to neck and muscle relaxer trial with ibuprofen prn.       Relevant Medications   methocarbamol (ROBAXIN) 500 MG tablet    Meds ordered this encounter  Medications   sertraline (ZOLOFT) 50 MG tablet    Sig: Take 1 tablet (50 mg total) by mouth daily.    Dispense:  30 tablet    Refill:  2    Order Specific Question:   Supervising Provider    Answer:   BEDSOLE, AMY E [[id number]]   methocarbamol (ROBAXIN) 500 MG tablet    Sig: Take one po qhs prn muscle spasm    Dispense:  20 tablet    Refill:  0    Order Specific Question:   Supervising Provider    Answer:   Ermalene SearingBEDSOLE, AMY E [[id number]]    Follow-up: Return in about 3 months (around 10/30/2021) for medication follow up.    Mort Sawyersabitha Tannah Dreyfuss, FNP

## 2021-07-30 NOTE — Assessment & Plan Note (Signed)
Tightness of the lower back neck ?D/w pt posture, exercises, relaxation, applying heat to neck and muscle relaxer trial with ibuprofen prn.  ?

## 2021-07-30 NOTE — Assessment & Plan Note (Signed)
Trial start zoloft.  ? I instructed pt to start 1/2 tablet once daily for 1 week and then increase to a full tablet once daily on week two as tolerated.  We discussed common side effects such as nausea, drowsiness and weight gain.  Also discussed rare but serious side effect of suicidal ideation.  She is instructed to discontinue medication and go directly to ED if this occurs.  Pt verbalizes understanding.  Plan is to follow up in 30 days to evaluate progress.   ? ? ?

## 2021-07-31 LAB — BASIC METABOLIC PANEL
BUN: 10 mg/dL (ref 6–23)
CO2: 27 mEq/L (ref 19–32)
Calcium: 9.6 mg/dL (ref 8.4–10.5)
Chloride: 104 mEq/L (ref 96–112)
Creatinine, Ser: 0.74 mg/dL (ref 0.40–1.20)
GFR: 116.73 mL/min (ref >60.00)
Glucose, Bld: 89 mg/dL (ref 70–99)
Potassium: 4.1 mEq/L (ref 3.5–5.1)
Sodium: 138 mEq/L (ref 135–145)

## 2021-07-31 LAB — SEDIMENTATION RATE: Sed Rate: 3 mm/hr (ref 0–20)

## 2021-07-31 LAB — CBC
HCT: 41.3 % (ref 36.0–46.0)
Hemoglobin: 14.4 g/dL (ref 12.0–15.0)
MCHC: 34.7 g/dL (ref 30.0–36.0)
MCV: 83.1 fl (ref 78.0–100.0)
Platelets: 312 10*3/uL (ref 150.0–400.0)
RBC: 4.97 Mil/uL (ref 3.87–5.11)
RDW: 12.9 % (ref 11.5–14.6)
WBC: 6.3 10*3/uL (ref 4.5–10.5)

## 2021-07-31 LAB — C-REACTIVE PROTEIN: CRP: <1 mg/dL (ref 0.5–20.0)

## 2021-07-31 LAB — TSH: TSH: 1.03 u[IU]/mL (ref 0.35–5.50)

## 2021-08-10 ENCOUNTER — Other Ambulatory Visit: Payer: Self-pay

## 2021-08-10 ENCOUNTER — Ambulatory Visit (LOCAL_COMMUNITY_HEALTH_CENTER): Payer: Self-pay

## 2021-08-10 DIAGNOSIS — Z111 Encounter for screening for respiratory tuberculosis: Secondary | ICD-10-CM

## 2021-08-13 ENCOUNTER — Ambulatory Visit (LOCAL_COMMUNITY_HEALTH_CENTER): Payer: Self-pay

## 2021-08-13 ENCOUNTER — Other Ambulatory Visit: Payer: Self-pay

## 2021-08-13 DIAGNOSIS — Z111 Encounter for screening for respiratory tuberculosis: Secondary | ICD-10-CM

## 2021-08-13 LAB — TB SKIN TEST
Induration: 0 mm
TB Skin Test: NEGATIVE

## 2021-09-05 ENCOUNTER — Telehealth: Payer: Self-pay | Admitting: Physician Assistant

## 2021-09-05 DIAGNOSIS — I160 Hypertensive urgency: Secondary | ICD-10-CM

## 2021-09-05 NOTE — Progress Notes (Signed)
?Virtual Visit Consent  ? ?Valerie Hayes, you are scheduled for a virtual visit with a [facility] provider today.   ?  ?Just as with appointments in the office, your consent must be obtained to participate.  Your consent will be active for this visit and any virtual visit you may have with one of our providers in the next 365 days.   ?  ?If you have a MyChart account, a copy of this consent can be sent to you electronically.  All virtual visits are billed to your insurance company just like a traditional visit in the office.   ? ?As this is a virtual visit, video technology does not allow for your provider to perform a traditional examination.  This may limit your provider's ability to fully assess your condition.  If your provider identifies any concerns that need to be evaluated in person or the need to arrange testing (such as labs, EKG, etc.), we will make arrangements to do so.   ?  ?Although advances in technology are sophisticated, we cannot ensure that it will always work on either your end or our end.  If the connection with a video visit is poor, the visit may have to be switched to a telephone visit.  With either a video or telephone visit, we are not always able to ensure that we have a secure connection.    ? ?Also, by engaging in this virtual visit, you consent to the provision of healthcare. Additionally, you authorize for your insurance to be billed (if applicable) for the services provided during this visit.  ? ?I need to obtain your verbal consent now.   Are you willing to proceed with your visit today?  ?  ?Valerie Hayes has provided verbal consent on 09/05/2021 for a virtual visit (video or telephone). ?  ?[name], PA-C  ? ?Date: 09/05/2021 5:37 PM ? ? ?Virtual Visit via Video Note  ? ?Valerie Hayes, connected with  Valerie Hayes  ([id number], 2001/03/25) on 09/05/21 at  5:30 PM EDT by a video-enabled telemedicine application and verified that I am speaking with the  correct person using two identifiers. ? ?Location: ?Patient: Virtual Visit Location Patient: Home ?Provider: Virtual Visit Location Provider: Home Office ?  ?I discussed the limitations of evaluation and management by telemedicine and the availability of in person appointments. The patient expressed understanding and agreed to proceed.   ? ?History of Present Illness: ?Valerie Hayes is a 21 y.o. who identifies as a female who was assigned female at birth, and is being seen today for HTN. 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 Later in the day she had her teacher recheck her BP and it was 160/110. She rechecked her BP once she was home and it was 170/120. She does have a headache and blurred vision associated. Denies chest pain, SOB, DOE, edema. Did see PCP one month ago and BP was normal (118/64). Labs (BMP, CBC, and TSH) all normal at that time as well.  ? ? ?Problems:  ?Patient Active Problem List  ? Diagnosis Date Noted  ? Migraine with aura and without status migrainosus, not intractable 07/30/2021  ? PCOS (polycystic ovarian syndrome) 07/30/2021  ? Insomnia secondary to depression with anxiety 07/30/2021  ? Missed period 07/30/2021  ? Other fatigue 07/30/2021  ? Muscle  spasm 07/30/2021  ?  ?Allergies:  ?Allergies  ?Allergen Reactions  ? Fexofenadine-Pseudoephed Er Nausea And Vomiting  ? ?Medications:  ?Current Outpatient Medications:  ?  methocarbamol (ROBAXIN) 500 MG tablet, Take one po qhs prn muscle spasm, Disp: 20 tablet, Rfl: 0 ?  sertraline (ZOLOFT) 50 MG tablet, Take 1 tablet (50 mg total) by mouth daily., Disp: 30 tablet, Rfl: 2 ? ?Observations/Objective: ?Patient is well-developed, well-nourished in no acute distress.  ?Resting comfortably at home.  ?Head is normocephalic, atraumatic.  ?No labored breathing.  ?Speech is clear and coherent with logical content.  ?Patient  is alert and oriented at baseline.  ? ? ?Assessment and Plan: ?1. Hypertensive urgency, malignant ? ?- Since this is an acute issue with associated symptoms I have advised her to proceed to closest ER; she voices understanding and agrees. ? ?Follow Up Instructions: ?I discussed the assessment and treatment plan with the patient. The patient was provided an opportunity to ask questions and all were answered. The patient agreed with the plan and demonstrated an understanding of the instructions.  A copy of instructions were sent to the patient via MyChart unless otherwise noted below.  ? ? ?The patient was advised to call back or seek an in-person evaluation if the symptoms worsen or if the condition fails to improve as anticipated. ? ?Time:  ?I spent 14 minutes with the patient via telehealth technology discussing the above problems/concerns.   ? ?[name], PA-C ?

## 2021-09-05 NOTE — Patient Instructions (Signed)
?  Valerie Hayes, thank you for joining Mar Daring, PA-C for today's virtual visit.  While this provider is not your primary care provider (PCP), if your PCP is located in our provider database this encounter information will be shared with them immediately following your visit. ? ?Consent: ?(Patient) Valerie Hayes provided verbal consent for this virtual visit at the beginning of the encounter. ? ?Current Medications: ? ?Current Outpatient Medications:  ?  methocarbamol (ROBAXIN) 500 MG tablet, Take one po qhs prn muscle spasm, Disp: 20 tablet, Rfl: 0 ?  sertraline (ZOLOFT) 50 MG tablet, Take 1 tablet (50 mg total) by mouth daily., Disp: 30 tablet, Rfl: 2  ? ?Medications ordered in this encounter:  ?No orders of the defined types were placed in this encounter. ?  ? ?*If you need refills on other medications prior to your next appointment, please contact your pharmacy* ? ?Follow-Up: ?Call back or seek an in-person evaluation if the symptoms worsen or if the condition fails to improve as anticipated. ? ?Other Instructions ?Proceed to ER ? ? ?If you have been instructed to have an in-person evaluation today at a local Urgent Care facility, please use the link below. It will take you to a list of all of our available [facility], including address, phone number and hours of operation. Please do not delay care.  ?[facility] ? ?If you or a family member do not have a primary care provider, use the link below to schedule a visit and establish care. When you choose a [facility] primary care physician or advanced practice provider, you gain a long-term partner in health. ?Find a Primary Care Provider ? ?Learn more about [facility] in-office and virtual care options: ?[facility] ?

## 2021-09-07 ENCOUNTER — Encounter: Payer: Self-pay | Admitting: Family

## 2021-09-07 ENCOUNTER — Ambulatory Visit (INDEPENDENT_AMBULATORY_CARE_PROVIDER_SITE_OTHER): Payer: Self-pay | Admitting: Family

## 2021-09-07 VITALS — BP 122/100 | HR 88 | Temp 98.2°F | Ht 63.0 in | Wt 199.6 lb

## 2021-09-07 DIAGNOSIS — J302 Other seasonal allergic rhinitis: Secondary | ICD-10-CM

## 2021-09-07 DIAGNOSIS — I10 Essential (primary) hypertension: Secondary | ICD-10-CM

## 2021-09-07 DIAGNOSIS — F411 Generalized anxiety disorder: Secondary | ICD-10-CM

## 2021-09-07 DIAGNOSIS — G44221 Chronic tension-type headache, intractable: Secondary | ICD-10-CM | POA: Insufficient documentation

## 2021-09-07 DIAGNOSIS — G43109 Migraine with aura, not intractable, without status migrainosus: Secondary | ICD-10-CM

## 2021-09-07 MED ORDER — LABETALOL HCL 100 MG PO TABS: 100.0000 mg | ORAL_TABLET | Freq: Two times a day (BID) | ORAL | 2 refills | Status: DC

## 2021-09-07 MED ORDER — SERTRALINE HCL 100 MG PO TABS
100.0000 mg | ORAL_TABLET | Freq: Every day | ORAL | 3 refills | Status: DC
Start: 2021-09-07 — End: 2023-02-25

## 2021-09-07 MED ORDER — BUTALBITAL-APAP-CAFFEINE 50-325-40 MG PO TABS: 1.0000 | ORAL_TABLET | Freq: Four times a day (QID) | ORAL | 0 refills | Status: DC | PRN

## 2021-09-07 NOTE — Assessment & Plan Note (Signed)
rx trial fioricet  ?Try to avoid migraine triggers as able. Treating HTN as well which may help decrease headache ?

## 2021-09-07 NOTE — Assessment & Plan Note (Signed)
Recommend daily claritin  ?

## 2021-09-07 NOTE — Assessment & Plan Note (Signed)
Increase zoloft to 100 mg once daily ?Try taking in am instead ?Handout for anxiety reducing techniques ?

## 2021-09-07 NOTE — Assessment & Plan Note (Addendum)
Start labetolol 100 mg bid  ?Choosing this agent as pt trying to conceive ?Pt advised of the following:  ?Continue medication as prescribed. Monitor blood pressure periodically and/or when you feel symptomatic. Goal is <130/90 on average. Ensure that you have rested for 30 minutes prior to checking your blood pressure. Record your readings and bring them to your next visit if necessary.work on a low sodium diet. ? ?

## 2021-09-07 NOTE — Assessment & Plan Note (Signed)
Trial fioricet 50-325-40 ?

## 2021-09-07 NOTE — Patient Instructions (Addendum)
Tiral of fioricet for abrupt d/c when headaches occur. Try this.  ? ?Start daily labetolol 100 mg twice daily for blood pressure.  ?Start monitoring your blood pressure daily, around the same time of day, for the next 2-3 weeks.  Ensure that you have rested for 30 minutes prior to checking your blood pressure. Record your readings and bring them to your next visit.please send me a mychart message in two weeks with your blood pressure log. ? ?Increase zoloft to 100 mg once daily. Try to take in am instead.  ? ?Recommend taking daily Claritin, not with the D .  ? ?Due to recent changes in healthcare laws, you may see results of your imaging and/or laboratory studies on MyChart before I have had a chance to review them.  I understand that in some cases there may be results that are confusing or concerning to you. Please understand that not all results are received at the same time and often I may need to interpret multiple results in order to provide you with the best plan of care or course of treatment. Therefore, I ask that you please give me 2 business days to thoroughly review all your results before contacting my office for clarification. Should we see a critical lab result, you will be contacted sooner.  ? ?It was a pleasure seeing you today! Please do not hesitate to reach out with any questions and or concerns. ? ?Regards,  ? ?Quincy Prisco ?FNP-C ? ?

## 2021-09-07 NOTE — Progress Notes (Signed)
? ?Established Patient Office Visit ? ?Subjective:  ?Patient ID: Valerie Hayes, female    DOB: [birth year]  Age: 21 y.o. MRN: [id number] ? ?CC:  ?Chief Complaint  ?Patient presents with  ? Anxiety  ? Migraine  ? ? ?HPI ?Valerie Hayes is here today with concerns.  ? ?Three days ago while in class, checked blood pressure, and it was 160/120.  ?Tried to go to urgent care, was told to go to ER bc was now 170/125, and she also had a headache with numbness spreading to her nose and bil sides of cheeks. Was evaluated for CVA, negative.  ?Pregnancy negative.  ?Cmp unremarkable.  ?Cbc unremarkable  ?CT head w/o contrast: no acute abnormalities  ? ?EKG reviewed, NSR ?Mild nausea/mild cramping. Some fatigue. Not overall feeling great and also with right ear pain on and off. While in office was given reglan, toradol, tylenol, and benadryl. Headache resolved.  ? ?[facility]  ? ?CT in office: negative for ICH.  ? ?Migraines/tension headache: does get muscle stiffness, does see chiropractor and does get massages. Feels tension in neck followed by headache all over scalp. Pulsing headache. No known sinus concerns when they occur but with ear pain that started two days ago. Has never tried Fioricet.  ? ?Anxiety: been on zoloft for about 1.5 months , with only slight improvement. Helping somewhat with anxiety but does make her want to eat and sleep. Taking at night. ? ?Elevated blood pressure, mom on lisinopril and has HCM, and had a cardiac ablation due to Svt.  ?Her blood pressure at home averages 140/100 or so. No cp palp or sob. Does often have headache.   ? ? ?No past medical history on file. ? ?Past Surgical History:  ?Procedure Laterality Date  ? LIPOMA RESECTION    ? MOUTH SURGERY    ? expose a teeth that would'nt come down  ? TONSILLECTOMY AND ADENOIDECTOMY    ? ? ?No family history on file. ? ?Social History  ? ?Socioeconomic History  ? Marital status: Single  ?  Spouse name: Not on file  ? Number of  children: Not on file  ? Years of education: Not on file  ? Highest education level: Not on file  ?Occupational History  ? Occupation: Theatre stage manager  ?Tobacco Use  ? Smoking status: Never  ? Smokeless tobacco: Never  ?Vaping Use  ? Vaping Use: Not on file  ?Substance and Sexual Activity  ? Alcohol use: Never  ? Drug use: Never  ? Sexual activity: Yes  ?  Partners: Male  ?  Birth control/protection: Coitus interruptus  ?Other Topics Concern  ? Not on file  ?Social History Narrative  ? Not on file  ? ?Social Determinants of Health  ? ?Financial Resource Strain: Not on file  ?Food Insecurity: Not on file  ?Transportation Needs: Not on file  ?Physical Activity: Not on file  ?Stress: Not on file  ?Social Connections: Not on file  ?Intimate Partner Violence: Not on file  ? ? ?Outpatient Medications Prior to Visit  ?Medication Sig Dispense Refill  ? methocarbamol (ROBAXIN) 500 MG tablet Take one po qhs prn muscle spasm 20 tablet 0  ? sertraline (ZOLOFT) 50 MG tablet Take 1 tablet (50 mg total) by mouth daily. 30 tablet 2  ? ?No facility-administered medications prior to visit.  ? ? ?Allergies  ?Allergen Reactions  ? Fexofenadine-Pseudoephed Er Nausea And Vomiting  ? ? ?ROS ?Review of Systems  ?Constitutional:  Negative  for chills, fatigue, fever and unexpected weight change.  ?HENT:  Positive for ear pain (right) and postnasal drip. Negative for sinus pressure, sneezing and sore throat.   ?Eyes:  Negative for visual disturbance.  ?Respiratory:  Negative for cough, chest tightness, shortness of breath and wheezing.   ?Cardiovascular:  Negative for chest pain, palpitations and leg swelling.  ?Gastrointestinal:  Negative for abdominal pain.  ?Genitourinary:  Negative for difficulty urinating.  ?Skin:  Negative for rash.  ?Neurological:  Positive for headaches. Negative for dizziness.  ? ?  ?Objective:  ?  ?Physical Exam ?Vitals reviewed.  ?Constitutional:   ?   General: She is not in acute distress. ?   Appearance: Normal  appearance. She is obese. She is not ill-appearing or toxic-appearing.  ?HENT:  ?   Right Ear: Tympanic membrane normal. Tenderness present.  ?   Left Ear: No tenderness. Tympanic membrane is erythematous (slight). Tympanic membrane is not retracted or bulging.  ?   Mouth/Throat:  ?   Mouth: Mucous membranes are moist.  ?   Pharynx: No pharyngeal swelling.  ?   Tonsils: No tonsillar exudate.  ?Eyes:  ?   Extraocular Movements: Extraocular movements intact.  ?   Conjunctiva/sclera: Conjunctivae normal.  ?   Pupils: Pupils are equal, round, and reactive to light.  ?Neck:  ?   Thyroid: No thyroid mass.  ?Cardiovascular:  ?   Rate and Rhythm: Normal rate and regular rhythm.  ?Pulmonary:  ?   Effort: Pulmonary effort is normal.  ?   Breath sounds: Normal breath sounds.  ?Musculoskeletal:     ?   General: Normal range of motion.  ?Lymphadenopathy:  ?   Cervical:  ?   Right cervical: No superficial cervical adenopathy. ?   Left cervical: No superficial cervical adenopathy.  ?Skin: ?   General: Skin is warm.  ?   Capillary Refill: Capillary refill takes less than 2 seconds.  ?Neurological:  ?   General: No focal deficit present.  ?   Mental Status: She is alert and oriented to person, place, and time.  ?   Cranial Nerves: No cranial nerve deficit.  ?Psychiatric:     ?   Mood and Affect: Mood normal.     ?   Behavior: Behavior normal.     ?   Thought Content: Thought content normal.     ?   Judgment: Judgment normal.  ? ? ?BP (!) 122/100   Pulse 88   Temp 98.2 ?F (36.8 ?C)   Ht 5\' 3"  (1.6 m)   Wt 199 lb 9 oz (90.5 kg)   SpO2 98%   BMI 35.35 kg/m?  ?Wt Readings from Last 3 Encounters:  ?09/07/21 199 lb 9 oz (90.5 kg)  ?07/30/21 197 lb (89.4 kg)  ?01/02/21 205 lb (93 kg) (98 %, Z= 2.02)*  ? ?* Growth percentiles are based on CDC (Girls, 2-20 Years) data.  ? ? ? ?Health Maintenance Due  ?Topic Date Due  ? COVID-19 Vaccine (1) Never done  ? CHLAMYDIA SCREENING  Never done  ? HIV Screening  Never done  ? Hepatitis C  Screening  Never done  ? ? ?There are no preventive care reminders to display for this patient. ? ?Lab Results  ?Component Value Date  ? TSH 1.03 07/30/2021  ? ?Lab Results  ?Component Value Date  ? WBC 6.3 07/30/2021  ? HGB 14.4 07/30/2021  ? HCT 41.3 07/30/2021  ? MCV 83.1 07/30/2021  ? PLT 312.0 07/30/2021  ? ?  Lab Results  ?Component Value Date  ? NA 138 07/30/2021  ? K 4.1 07/30/2021  ? CO2 27 07/30/2021  ? GLUCOSE 89 07/30/2021  ? BUN 10 07/30/2021  ? CREATININE 0.74 07/30/2021  ? BILITOT 0.7 01/02/2021  ? ALKPHOS 42 01/02/2021  ? AST 19 01/02/2021  ? ALT 23 01/02/2021  ? PROT 7.4 01/02/2021  ? ALBUMIN 4.4 01/02/2021  ? CALCIUM 9.6 07/30/2021  ? ANIONGAP 6 01/02/2021  ? GFR 116.73 07/30/2021  ? ?Lab Results  ?Component Value Date  ? HGBA1C 5.5 11/21/2020  ? ? ?  ?Assessment & Plan:  ? ?Problem List Items Addressed This Visit   ? ?  ? Cardiovascular and Mediastinum  ? Migraine with aura and without status migrainosus, not intractable  ?  rx trial fioricet  ?Try to avoid migraine triggers as able. Treating HTN as well which may help decrease headache ? ?  ?  ? Relevant Medications  ? butalbital-acetaminophen-caffeine (FIORICET) 50-325-40 MG tablet  ? sertraline (ZOLOFT) 100 MG tablet  ? labetalol (NORMODYNE) 100 MG tablet  ? Essential (primary) hypertension  ?  Start labetolol 100 mg bid  ?Choosing this agent as pt trying to conceive ?Pt advised of the following:  ?Continue medication as prescribed. Monitor blood pressure periodically and/or when you feel symptomatic. Goal is <130/90 on average. Ensure that you have rested for 30 minutes prior to checking your blood pressure. Record your readings and bring them to your next visit if necessary.work on a low sodium diet. ? ?  ?  ? Relevant Medications  ? labetalol (NORMODYNE) 100 MG tablet  ?  ? Respiratory  ? Seasonal allergic rhinitis  ?  Recommend daily claritin  ? ?  ?  ?  ? Other  ? Chronic tension-type headache, intractable - Primary  ?  Trial fioricet  50-325-40 ? ?  ?  ? Relevant Medications  ? butalbital-acetaminophen-caffeine (FIORICET) 50-325-40 MG tablet  ? sertraline (ZOLOFT) 100 MG tablet  ? labetalol (NORMODYNE) 100 MG tablet  ? Anxiety, generalized  ?  Increase zoloft

## 2021-09-14 ENCOUNTER — Ambulatory Visit (INDEPENDENT_AMBULATORY_CARE_PROVIDER_SITE_OTHER): Payer: Self-pay

## 2021-09-14 DIAGNOSIS — Z3689 Encounter for other specified antenatal screening: Secondary | ICD-10-CM

## 2021-09-14 DIAGNOSIS — Z3480 Encounter for supervision of other normal pregnancy, unspecified trimester: Secondary | ICD-10-CM | POA: Insufficient documentation

## 2021-09-14 NOTE — Initial Assessments (Signed)
New OB Intake ? ?I connected with  Valerie Hayes on 09/14/21 at 10:15 AM EDT by telephone Telephone Visit and verified that I am speaking with the correct person using two identifiers. Nurse is located at [facility] and pt is located at home. ? ?I explained I am completing New OB Intake today. We discussed her EDD of 12/20/22023 that is based on LMP of 08/01/2021. Pt is G2/P0010. I reviewed her allergies, medications, Medical/Surgical/OB history, and appropriate screenings. Based on history, this is a/an  pregnancy uncomplicated .  ? ?Patient Active Problem List  ? Diagnosis Date Noted  ? Supervision of other normal pregnancy, antepartum 09/14/2021  ? Essential (primary) hypertension 09/07/2021  ? Chronic tension-type headache, intractable 09/07/2021  ? Seasonal allergic rhinitis 09/07/2021  ? Anxiety, generalized 09/07/2021  ? Migraine with aura and without status migrainosus, not intractable 07/30/2021  ? PCOS (polycystic ovarian syndrome) 07/30/2021  ? Insomnia secondary to depression with anxiety 07/30/2021  ? Missed period 07/30/2021  ? Other fatigue 07/30/2021  ? Muscle spasm 07/30/2021  ? ? ?Concerns addressed today ?Pt wanted to know if at NOB appt would a beta HCG or u/s be done.  Adv to d/w provider as beta can certainly be added to NOB labs.  Pt states FOB was born with renal fusion. ? ?Delivery Plans:  ?Plans to deliver at [facility].  ? ? [place] ?Explained first scheduled [place] will be scheduled at her first visit with provider. ? ?Labs ?Pt aware NOB labs will be drawn at first visit with provider. ? ?Patient has covid vaccine.   ? ?Social Determinants of Health ?Food Insecurity: Patient denies food insecurity.  ?Transportation: Patient denies transportation needs. ? ?Placed OB Box on problem list and updated ? ?First visit review ?I reviewed new OB appt with pt. I explained she will have a pelvic exam, ob bloodwork with genetic screening, and PAP smear. Explained pt will be  seen by Carie Caddy, CNM at first visit; encounter routed to appropriate provider.   ? ?Valerie Hayes, [facility]) ?09/14/2021  10:43 AM  ?

## 2021-09-24 ENCOUNTER — Encounter: Payer: Self-pay | Admitting: Family

## 2021-10-01 ENCOUNTER — Encounter: Payer: Self-pay | Admitting: Family

## 2021-10-01 DIAGNOSIS — I10 Essential (primary) hypertension: Secondary | ICD-10-CM

## 2021-10-01 MED ORDER — LABETALOL HCL 100 MG PO TABS: 100.0000 mg | ORAL_TABLET | Freq: Two times a day (BID) | ORAL | 1 refills | Status: DC

## 2021-10-03 ENCOUNTER — Encounter: Payer: Self-pay | Admitting: Licensed Practical Nurse

## 2021-10-12 ENCOUNTER — Encounter: Payer: Self-pay | Admitting: Physician Assistant

## 2021-10-12 ENCOUNTER — Telehealth: Payer: Self-pay | Admitting: Physician Assistant

## 2021-10-12 DIAGNOSIS — R3989 Other symptoms and signs involving the genitourinary system: Secondary | ICD-10-CM

## 2021-10-12 MED ORDER — CEPHALEXIN 500 MG PO CAPS: 500.0000 mg | ORAL_CAPSULE | Freq: Two times a day (BID) | ORAL | 0 refills | Status: AC

## 2021-10-12 NOTE — Progress Notes (Signed)
Virtual Visit Consent   Valerie Hayes, you are scheduled for a virtual visit with a [facility] provider today. Just as with appointments in the office, your consent must be obtained to participate. Your consent will be active for this visit and any virtual visit you may have with one of our providers in the next 365 days. If you have a MyChart account, a copy of this consent can be sent to you electronically.  As this is a virtual visit, video technology does not allow for your provider to perform a traditional examination. This may limit your provider's ability to fully assess your condition. If your provider identifies any concerns that need to be evaluated in person or the need to arrange testing (such as labs, EKG, etc.), we will make arrangements to do so. Although advances in technology are sophisticated, we cannot ensure that it will always work on either your end or our end. If the connection with a video visit is poor, the visit may have to be switched to a telephone visit. With either a video or telephone visit, we are not always able to ensure that we have a secure connection.  By engaging in this virtual visit, you consent to the provision of healthcare and authorize for your insurance to be billed (if applicable) for the services provided during this visit. Depending on your insurance coverage, you may receive a charge related to this service.  I need to obtain your verbal consent now. Are you willing to proceed with your visit today? [name] has provided verbal consent on 10/12/2021 for a virtual visit (video or telephone). [name], [place]  Date: 10/12/2021 8:07 AM  Virtual Visit via Video Note   I, [name], connected with  IMMACULATE CRUTCHER  ([id number], [birth year]) on 10/12/21 at  8:00 AM EDT by a video-enabled telemedicine application and verified that I am speaking with the correct person using two identifiers.  Location: Patient: Virtual Visit  Location Patient: Home Provider: Virtual Visit Location Provider: Home Office   I discussed the limitations of evaluation and management by telemedicine and the availability of in person appointments. The patient expressed understanding and agreed to proceed.    History of Present Illness: Valerie Hayes is a 21 y.o. who identifies as a female who was assigned female at birth, and is being seen today for possible UTI. Patient endorses 3-4 days of urinary urgency, frequency with suprapubic pressure. Some discomfort but no overt burning with urination. Denies fever, chills, flank pain, vomiting. Occasional nausea. Denies concerns for pregnancy or STI. Is listed as [redacted] weeks pregnant in chart but patient denies this and with multiple negative pregnancy tests in the past 2 months listed in her EMR.  HPI: HPI  Problems:  Patient Active Problem List   Diagnosis Date Noted   Supervision of other normal pregnancy, antepartum 09/14/2021   Essential (primary) hypertension 09/07/2021   Chronic tension-type headache, intractable 09/07/2021   Seasonal allergic rhinitis 09/07/2021   Anxiety, generalized 09/07/2021   Migraine with aura and without status migrainosus, not intractable 07/30/2021   PCOS (polycystic ovarian syndrome) 07/30/2021   Insomnia secondary to depression with anxiety 07/30/2021   Missed period 07/30/2021   Other fatigue 07/30/2021   Muscle spasm 07/30/2021    Allergies:  Allergies  Allergen Reactions   Fexofenadine-Pseudoephed Er Nausea And Vomiting    All allergy medications cause nausea and vomiting   Medications:  Current Outpatient Medications:    cephALEXin (KEFLEX) 500  MG capsule, Take 1 capsule (500 mg total) by mouth 2 (two) times daily for 7 days., Disp: 14 capsule, Rfl: 0   butalbital-acetaminophen-caffeine (FIORICET) 50-325-40 MG tablet, Take 1 tablet by mouth every 6 (six) hours as needed for headache., Disp: 14 tablet, Rfl: 0   labetalol (NORMODYNE) 100 MG  tablet, Take 1 tablet (100 mg total) by mouth 2 (two) times daily., Disp: 180 tablet, Rfl: 1   methocarbamol (ROBAXIN) 500 MG tablet, Take one po qhs prn muscle spasm, Disp: 20 tablet, Rfl: 0   sertraline (ZOLOFT) 100 MG tablet, Take 1 tablet (100 mg total) by mouth daily., Disp: 30 tablet, Rfl: 3  Observations/Objective: Patient is well-developed, well-nourished in no acute distress.  Resting comfortably at home.  Head is normocephalic, atraumatic.  No labored breathing. Speech is clear and coherent with logical content.  Patient is alert and oriented at baseline.   Assessment and Plan: 1. Suspected UTI - cephALEXin (KEFLEX) 500 MG capsule; Take 1 capsule (500 mg total) by mouth 2 (two) times daily for 7 days.  Dispense: 14 capsule; Refill: 0  Classic symptoms in non-pregnant patient. Recent urine preg tests all negative so her pregnancy status was corrected in chart. No alarm signs or symptoms. Will start treatment for uncomplicated cystitis with Keflex 500 mg BID. Supportive measures and OTC medications reviewed. Precautions for in-person evaluation reviewed with patient. Work note declined.   Follow Up Instructions: I discussed the assessment and treatment plan with the patient. The patient was provided an opportunity to ask questions and all were answered. The patient agreed with the plan and demonstrated an understanding of the instructions.  A copy of instructions were sent to the patient via MyChart unless otherwise noted below.   The patient was advised to call back or seek an in-person evaluation if the symptoms worsen or if the condition fails to improve as anticipated.  Time:  I spent 10 minutes with the patient via telehealth technology discussing the above problems/concerns.    [name], PA-C

## 2021-10-12 NOTE — Patient Instructions (Signed)
Valerie Hayes, thank you for joining [name], PA-C for today's virtual visit.  While this provider is not your primary care provider (PCP), if your PCP is located in our provider database this encounter information will be shared with them immediately following your visit.  Consent: (Patient) Valerie Hayes provided verbal consent for this virtual visit at the beginning of the encounter.  Current Medications:  Current Outpatient Medications:    butalbital-acetaminophen-caffeine (FIORICET) 50-325-40 MG tablet, Take 1 tablet by mouth every 6 (six) hours as needed for headache., Disp: 14 tablet, Rfl: 0   labetalol (NORMODYNE) 100 MG tablet, Take 1 tablet (100 mg total) by mouth 2 (two) times daily., Disp: 180 tablet, Rfl: 1   methocarbamol (ROBAXIN) 500 MG tablet, Take one po qhs prn muscle spasm, Disp: 20 tablet, Rfl: 0   sertraline (ZOLOFT) 100 MG tablet, Take 1 tablet (100 mg total) by mouth daily., Disp: 30 tablet, Rfl: 3   Medications ordered in this encounter:  No orders of the defined types were placed in this encounter.    *If you need refills on other medications prior to your next appointment, please contact your pharmacy*  Follow-Up: Call back or seek an in-person evaluation if the symptoms worsen or if the condition fails to improve as anticipated.  Other Instructions Your symptoms are consistent with a bladder infection, also called acute cystitis. Please take your antibiotic (Keflex) as directed until all pills are gone.  Stay very well hydrated.  Consider a daily probiotic (Align, Culturelle, or Activia) to help prevent stomach upset caused by the antibiotic.  Taking a probiotic daily may also help prevent recurrent UTIs.  Also consider taking AZO (Phenazopyridine) tablets to help decrease pain with urination.  I will call you with your urine testing results.  We will change antibiotics if indicated.  Call or return to clinic if symptoms are not resolved by  completion of antibiotic.   Urinary Tract Infection A urinary tract infection (UTI) can occur any place along the urinary tract. The tract includes the kidneys, ureters, bladder, and urethra. A type of germ called bacteria often causes a UTI. UTIs are often helped with antibiotic medicine.  HOME CARE  If given, take antibiotics as told by your doctor. Finish them even if you start to feel better. Drink enough fluids to keep your pee (urine) clear or pale yellow. Avoid tea, drinks with caffeine, and bubbly (carbonated) drinks. Pee often. Avoid holding your pee in for a long time. Pee before and after having sex (intercourse). Wipe from front to back after you poop (bowel movement) if you are a woman. Use each tissue only once. GET HELP RIGHT AWAY IF:  You have back pain. You have lower belly (abdominal) pain. You have chills. You feel sick to your stomach (nauseous). You throw up (vomit). Your burning or discomfort with peeing does not go away. You have a fever. Your symptoms are not better in 3 days. MAKE SURE YOU:  Understand these instructions. Will watch your condition. Will get help right away if you are not doing well or get worse. Document Released: 10/23/2007 Document Revised: 01/29/2012 Document Reviewed: 12/05/2011 [facility] Patient Information 2015 [place], [place]. This information is not intended to replace advice given to you by your health care provider. Make sure you discuss any questions you have with your health care provider.    If you have been instructed to have an in-person evaluation today at a local Urgent Care facility, please use the link  below. It will take you to a list of all of our available [facility], including address, phone number and hours of operation. Please do not delay care.  [facility]  If you or a family member do not have a primary care provider, use the link below to schedule a visit and establish care. When you  choose a [facility] primary care physician or advanced practice provider, you gain a long-term partner in health. Find a Primary Care Provider  Learn more about [facility]'s in-office and virtual care options: [facility] - Get Care Now

## 2021-10-23 ENCOUNTER — Encounter: Payer: Self-pay | Admitting: Family

## 2021-10-23 ENCOUNTER — Ambulatory Visit (INDEPENDENT_AMBULATORY_CARE_PROVIDER_SITE_OTHER): Payer: Self-pay | Admitting: Family

## 2021-10-23 VITALS — BP 124/90 | HR 71 | Temp 98.3°F | Resp 16 | Ht 63.0 in | Wt 205.2 lb

## 2021-10-23 DIAGNOSIS — B9689 Other specified bacterial agents as the cause of diseases classified elsewhere: Secondary | ICD-10-CM | POA: Insufficient documentation

## 2021-10-23 DIAGNOSIS — N760 Acute vaginitis: Secondary | ICD-10-CM | POA: Insufficient documentation

## 2021-10-23 DIAGNOSIS — N898 Other specified noninflammatory disorders of vagina: Secondary | ICD-10-CM | POA: Insufficient documentation

## 2021-10-23 DIAGNOSIS — R350 Frequency of micturition: Secondary | ICD-10-CM | POA: Insufficient documentation

## 2021-10-23 LAB — POC URINALSYSI DIPSTICK (AUTOMATED)
Bilirubin, UA: NEGATIVE
Blood, UA: NEGATIVE
Glucose, UA: NEGATIVE
Ketones, UA: NEGATIVE
Nitrite, UA: NEGATIVE
Protein, UA: POSITIVE — AB
Spec Grav, UA: 1.015 (ref 1.010–1.025)
Urobilinogen, UA: 0.2 E.U./dL
pH, UA: 7 (ref 5.0–8.0)

## 2021-10-23 LAB — POCT URINE PREGNANCY: Preg Test, Ur: NEGATIVE

## 2021-10-23 MED ORDER — METRONIDAZOLE 500 MG PO TABS
500.0000 mg | ORAL_TABLET | Freq: Two times a day (BID) | ORAL | 0 refills | Status: AC
Start: 2021-10-23 — End: 2021-10-30

## 2021-10-23 NOTE — Assessment & Plan Note (Signed)
rx flagyl 500 mg po bid x 7 days  Awaiting wet prep for confirmation as well but will treat first.

## 2021-10-23 NOTE — Progress Notes (Signed)
Established Patient Office Visit  Subjective:  Patient ID: Valerie Hayes, female    DOB: 12/06/2000  Age: 21 y.o. MRN: [id number]  CC:  Chief Complaint  Patient presents with   Urinary Tract Infection    Had a virtual May 26 finished antibiotic last Friday, but symptoms worse and notice blood in urine. Pt also had fever yesterday. Feels like someone is taking a razor blade to vagina     HPI Valerie Hayes is here today with concerns.   Pt states that she is having dysuria that is very painflul.  Does have white cloudy vaginal discharge, more milky.  Alba CoryLabias are painful and itchy as well, most of the pain is painful.  She was seen on video visit 5/26 and given keflex with no relief of symptoms.   Does have urinary frequency and urinary urgency.    Past Medical History:  Diagnosis Date   Chronic tension headaches    Hypertension    PCOS (polycystic ovarian syndrome)     Past Surgical History:  Procedure Laterality Date   LIPOMA RESECTION     MOUTH SURGERY     expose a teeth that would'nt come down   TONSILLECTOMY AND ADENOIDECTOMY      Family History  Problem Relation Age of Onset   Heart disease Mother    Diabetes Father    Other Sister        pituitary gland tumor - benign   Diabetes Maternal Grandmother    Cancer Maternal Grandmother        colon/rectal; uterine 1583yrs ago   Heart disease Maternal Grandfather    Cancer Maternal Grandfather        colon   Diabetes Paternal Grandmother    Cancer Paternal Grandmother        colon/rectal   Diabetes Paternal Grandfather    Heart disease Paternal Grandfather     Social History   Socioeconomic History   Marital status: Single    Spouse name: Not on file   Number of children: 0   Years of education: 14.5   Highest education level: Not on file  Occupational History   Occupation: Theatre stage managernursing student  Tobacco Use   Smoking status: Never   Smokeless tobacco: Never  Vaping Use   Vaping Use: Never used   Substance and Sexual Activity   Alcohol use: Never   Drug use: Never   Sexual activity: Yes    Partners: Male    Birth control/protection: Coitus interruptus  Other Topics Concern   Not on file  Social History Narrative   Not on file   Social Determinants of Health   Financial Resource Strain: Low Risk    Difficulty of Paying Living Expenses: Not hard at all  Food Insecurity: No Food Insecurity   Worried About Programme researcher, broadcasting/film/videounning Out of Food in the Last Year: Never true   Ran Out of Food in the Last Year: Never true  Transportation Needs: No Transportation Needs   Lack of Transportation (Medical): No   Lack of Transportation (Non-Medical): No  Physical Activity: Sufficiently Active   Days of Exercise per Week: 4 days   Minutes of Exercise per Session: 90 min  Stress: Stress Concern Present   Feeling of Stress : To some extent  Social Connections: Moderately Integrated   Frequency of Communication with Friends and Family: More than three times a week   Frequency of Social Gatherings with Friends and Family: Three times a week   Attends Religious Services:  More than 4 times per year   Active Member of Clubs or Organizations: No   Attends Banker Meetings: Never   Marital Status: Living with partner  Intimate Partner Violence: Not At Risk   Fear of Current or Ex-Partner: No   Emotionally Abused: No   Physically Abused: No   Sexually Abused: No    Outpatient Medications Prior to Visit  Medication Sig Dispense Refill   butalbital-acetaminophen-caffeine (FIORICET) 50-325-40 MG tablet Take 1 tablet by mouth every 6 (six) hours as needed for headache. 14 tablet 0   labetalol (NORMODYNE) 100 MG tablet Take 1 tablet (100 mg total) by mouth 2 (two) times daily. (Patient taking differently: Take 200 mg by mouth 2 (two) times daily.) 180 tablet 1   methocarbamol (ROBAXIN) 500 MG tablet Take one po qhs prn muscle spasm 20 tablet 0   sertraline (ZOLOFT) 100 MG tablet Take 1 tablet  (100 mg total) by mouth daily. 30 tablet 3   No facility-administered medications prior to visit.    Allergies  Allergen Reactions   Fexofenadine-Pseudoephed Er Nausea And Vomiting    All allergy medications cause nausea and vomiting        Objective:    Physical Exam Constitutional:      General: She is not in acute distress.    Appearance: Normal appearance. She is normal weight. She is not ill-appearing, toxic-appearing or diaphoretic.  Cardiovascular:     Rate and Rhythm: Normal rate and regular rhythm.  Pulmonary:     Effort: Pulmonary effort is normal.     Breath sounds: Normal breath sounds.  Chest:  Breasts:    Tanner Score is 5.     Breasts are symmetrical.     Right: Normal. No swelling, bleeding, inverted nipple, mass, nipple discharge, skin change or tenderness.     Left: Normal. No swelling, bleeding, inverted nipple, mass, nipple discharge, skin change or tenderness.  Abdominal:     General: Abdomen is flat.  Genitourinary:    General: Normal vulva.     Pubic Area: No rash or pubic lice.      Labia:        Right: Rash (redness) and tenderness present. No lesion or injury.        Left: Rash (redness) and tenderness present. No lesion or injury.      Urethra: No prolapse, urethral pain or urethral lesion.     Vagina: Vaginal discharge (white thin milky discharge) present. No tenderness.     Cervix: No discharge, erythema or cervical bleeding.     Adnexa:        Right: No mass, tenderness or fullness.         Left: No mass, tenderness or fullness.       Rectum: No external hemorrhoid.  Lymphadenopathy:     Upper Body:     Right upper body: No supraclavicular, axillary or pectoral adenopathy.     Left upper body: No supraclavicular, axillary or pectoral adenopathy.  Skin:    General: Skin is warm.  Neurological:     General: No focal deficit present.     Mental Status: She is alert and oriented to person, place, and time. Mental status is at baseline.   Psychiatric:        Mood and Affect: Mood normal.        Behavior: Behavior normal.        Thought Content: Thought content normal.        Judgment: Judgment  normal.    BP 124/90   Pulse 71   Temp 98.3 F (36.8 C)   Resp 16   Ht 5\' 3"  (1.6 m)   Wt 205 lb 3 oz (93.1 kg)   LMP 08/01/2021 (Approximate)   SpO2 99%   Breastfeeding No   BMI 36.35 kg/m  Wt Readings from Last 3 Encounters:  10/23/21 205 lb 3 oz (93.1 kg)  09/07/21 199 lb 9 oz (90.5 kg)  07/30/21 197 lb (89.4 kg)     Health Maintenance Due  Topic Date Due   CHLAMYDIA SCREENING  Never done   HIV Screening  Never done   Hepatitis C Screening  Never done    There are no preventive care reminders to display for this patient.  Lab Results  Component Value Date   TSH 1.03 07/30/2021   Lab Results  Component Value Date   WBC 6.3 07/30/2021   HGB 14.4 07/30/2021   HCT 41.3 07/30/2021   MCV 83.1 07/30/2021   PLT 312.0 07/30/2021   Lab Results  Component Value Date   NA 138 07/30/2021   K 4.1 07/30/2021   CO2 27 07/30/2021   GLUCOSE 89 07/30/2021   BUN 10 07/30/2021   CREATININE 0.74 07/30/2021   BILITOT 0.7 01/02/2021   ALKPHOS 42 01/02/2021   AST 19 01/02/2021   ALT 23 01/02/2021   PROT 7.4 01/02/2021   ALBUMIN 4.4 01/02/2021   CALCIUM 9.6 07/30/2021   ANIONGAP 6 01/02/2021   GFR 116.73 07/30/2021   Lab Results  Component Value Date   HGBA1C 5.5 11/21/2020      Assessment & Plan:   Problem List Items Addressed This Visit       Genitourinary   Bacterial vaginosis    rx flagyl 500 mg po bid x 7 days  Awaiting wet prep for confirmation as well but will treat first.        Relevant Medications   metroNIDAZOLE (FLAGYL) 500 MG tablet     Other   Urinary frequency - Primary    hcg test today in office poct dip reviewed, + leukocytes  Ordering urine culture as well pending results.         Relevant Orders   Urine Culture   POCT Urinalysis Dipstick (Automated) (Completed)    WET PREP BY MOLECULAR PROBE   POCT urine pregnancy   Vaginal discharge    Wet prep today in office  Suspected BV will treat       Relevant Orders   WET PREP BY MOLECULAR PROBE    Meds ordered this encounter  Medications   metroNIDAZOLE (FLAGYL) 500 MG tablet    Sig: Take 1 tablet (500 mg total) by mouth 2 (two) times daily for 7 days.    Dispense:  14 tablet    Refill:  0    Order Specific Question:   Supervising Provider    Answer:   BEDSOLE, AMY E [[id number]]    Follow-up: Return if symptoms worsen or fail to improve.    01/22/2021, FNP

## 2021-10-23 NOTE — Assessment & Plan Note (Addendum)
Wet prep today in office  Suspected BV will treat

## 2021-10-23 NOTE — Assessment & Plan Note (Signed)
hcg test today in office poct dip reviewed, + leukocytes  Ordering urine culture as well pending results.

## 2021-10-23 NOTE — Patient Instructions (Signed)
If the urine culture comes back positive I will let you know if we need to take another antibiotic.   Due to recent changes in healthcare laws, you may see results of your imaging and/or laboratory studies on MyChart before I have had a chance to review them.  I understand that in some cases there may be results that are confusing or concerning to you. Please understand that not all results are received at the same time and often I may need to interpret multiple results in order to provide you with the best plan of care or course of treatment. Therefore, I ask that you please give me 2 business days to thoroughly review all your results before contacting my office for clarification. Should we see a critical lab result, you will be contacted sooner.   It was a pleasure seeing you today! Please do not hesitate to reach out with any questions and or concerns.  Regards,   Mort Sawyers FNP-C

## 2021-10-25 LAB — WET PREP BY MOLECULAR PROBE
Candida species: NOT DETECTED
MICRO NUMBER:: 13489403
SPECIMEN QUALITY:: ADEQUATE
Trichomonas vaginosis: NOT DETECTED

## 2021-10-25 LAB — URINE CULTURE
MICRO NUMBER:: 13489404
SPECIMEN QUALITY:: ADEQUATE

## 2021-10-26 ENCOUNTER — Other Ambulatory Visit: Payer: Self-pay | Admitting: Family

## 2021-10-26 ENCOUNTER — Encounter: Payer: Self-pay | Admitting: Family

## 2021-10-26 DIAGNOSIS — N3000 Acute cystitis without hematuria: Secondary | ICD-10-CM

## 2021-10-26 MED ORDER — AMOXICILLIN-POT CLAVULANATE 875-125 MG PO TABS
1.0000 | ORAL_TABLET | Freq: Two times a day (BID) | ORAL | 0 refills | Status: DC
Start: 2021-10-26 — End: 2021-12-04

## 2021-10-29 ENCOUNTER — Ambulatory Visit (INDEPENDENT_AMBULATORY_CARE_PROVIDER_SITE_OTHER): Payer: Self-pay | Admitting: Family

## 2021-10-29 VITALS — BP 108/62 | HR 74 | Temp 97.4°F | Resp 12 | Ht 63.0 in | Wt 204.2 lb

## 2021-10-29 DIAGNOSIS — I10 Essential (primary) hypertension: Secondary | ICD-10-CM

## 2021-10-29 DIAGNOSIS — E88819 Insulin resistance, unspecified: Secondary | ICD-10-CM | POA: Insufficient documentation

## 2021-10-29 DIAGNOSIS — R635 Abnormal weight gain: Secondary | ICD-10-CM

## 2021-10-29 DIAGNOSIS — E282 Polycystic ovarian syndrome: Secondary | ICD-10-CM

## 2021-10-29 DIAGNOSIS — E8881 Metabolic syndrome: Secondary | ICD-10-CM

## 2021-10-29 LAB — POCT GLYCOSYLATED HEMOGLOBIN (HGB A1C): Hemoglobin A1C: 5.4 % (ref 4.0–5.6)

## 2021-10-29 MED ORDER — LABETALOL HCL 200 MG PO TABS: 200.0000 mg | ORAL_TABLET | Freq: Two times a day (BID) | ORAL | Status: DC

## 2021-10-29 MED ORDER — TIRZEPATIDE 2.5 MG/0.5ML ~~LOC~~ SOAJ: 2.5000 mg | SUBCUTANEOUS | 0 refills | Status: DC

## 2021-10-29 NOTE — Patient Instructions (Signed)
A referral was placed today for endocrinology, gyn and nutritionist.  Please let us know if you have not heard back within 2 weeks about the referral.  Due to recent changes in healthcare laws, you may see results of your imaging and/or laboratory studies on MyChart before I have had a chance to review them.  I understand that in some cases there may be results that are confusing or concerning to you. Please understand that not all results are received at the same time and often I may need to interpret multiple results in order to provide you with the best plan of care or course of treatment. Therefore, I ask that you please give me 2 business days to thoroughly review all your results before contacting my office for clarification. Should we see a critical lab result, you will be contacted sooner.   It was a pleasure seeing you today! Please do not hesitate to reach out with any questions and or concerns.  Regards,   Mort Sawyers FNP-C

## 2021-10-29 NOTE — Assessment & Plan Note (Addendum)
Did d/w pt might want to try progesterone only ocps and or other options Pt can't try metformin because she had intolerance.  Can consider iud with gyn referral Reviewed past dhea, low.  Pt is interested in weight loss options to help with pcos.  Will consider mounjaro, sending rx

## 2021-10-29 NOTE — Assessment & Plan Note (Signed)
a1c in office today to determine if prediabetic Reviewed, pt is not.  Will consider mounjaro for weight loss

## 2021-10-29 NOTE — Telephone Encounter (Signed)
Saw pt in office. Refer to note.

## 2021-10-29 NOTE — Progress Notes (Signed)
Established Patient Office Visit  Subjective:  Patient ID: Valerie Hayes, female    DOB: November 03, 2000  Age: 21 y.o. MRN: [id number]  CC:  Chief Complaint  Patient presents with   PCOS management    And she is insulin resistant. Was diagnosed at 21 years of age. Metformin was not helpful in the past.    HPI [name] is here today for follow up.   Pt is with acute concerns.  BV: currently on last day of flagyl , feeling much better.  Also had a UTI, currently still on augmentin therapy. Tolerating well.   With concerns today, dx with PCOS back when she was 21 y/o. Did see pediatric endo in the past, tried metformin but she was puking from the side effects. Had also tried birth control in the past but it made her angry. Currently with irregular periods, when they come they are two weeks, heavy bleeding with pad/tampon about every one hour. Also gaining weight and hard to lose weight, even when she exercises and eats right. Has noticed also with insulin resistance and sees sugars go from high to low, 150 at times other times drops to 40 . She will check when she starts to feel woozy. Hormones were checked last year, low DHEA at 68.6. was thinking more on the weight loss part. She wants to work on weight loss and might need some help in this regard.  Wt Readings from Last 3 Encounters:  10/29/21 204 lb 4 oz (92.6 kg)  10/23/21 205 lb 3 oz (93.1 kg)  09/07/21 199 lb 9 oz (90.5 kg)   Lab Results  Component Value Date   TSH 1.03 07/30/2021   Migraines: about once per week she experiences the migraines, hasn't been terrible lately.    Past Medical History:  Diagnosis Date   Chronic tension headaches    Hypertension    PCOS (polycystic ovarian syndrome)     Past Surgical History:  Procedure Laterality Date   LIPOMA RESECTION     MOUTH SURGERY     expose a teeth that would'nt come down   TONSILLECTOMY AND ADENOIDECTOMY      Family History  Problem Relation Age of  Onset   Heart disease Mother    Diabetes Father    Other Sister        pituitary gland tumor - benign   Diabetes Maternal Grandmother    Cancer Maternal Grandmother        colon/rectal; uterine 68yrs ago   Heart disease Maternal Grandfather    Cancer Maternal Grandfather        colon   Diabetes Paternal Grandmother    Cancer Paternal Grandmother        colon/rectal   Diabetes Paternal Grandfather    Heart disease Paternal Grandfather     Social History   Socioeconomic History   Marital status: Single    Spouse name: Not on file   Number of children: 0   Years of education: 14.5   Highest education level: Not on file  Occupational History   Occupation: Theatre stage manager  Tobacco Use   Smoking status: Never   Smokeless tobacco: Never  Vaping Use   Vaping Use: Never used  Substance and Sexual Activity   Alcohol use: Never   Drug use: Never   Sexual activity: Yes    Partners: Male    Birth control/protection: Coitus interruptus  Other Topics Concern   Not on file  Social History Narrative  Not on file   Social Determinants of Health   Financial Resource Strain: Low Risk  (09/14/2021)   Overall Financial Resource Strain (CARDIA)    Difficulty of Paying Living Expenses: Not hard at all  Food Insecurity: No Food Insecurity (09/14/2021)   Hunger Vital Sign    Worried About Running Out of Food in the Last Year: Never true    Ran Out of Food in the Last Year: Never true  Transportation Needs: No Transportation Needs (09/14/2021)   PRAPARE - Administrator, Civil ServiceTransportation    Lack of Transportation (Medical): No    Lack of Transportation (Non-Medical): No  Physical Activity: Sufficiently Active (09/14/2021)   Exercise Vital Sign    Days of Exercise per Week: 4 days    Minutes of Exercise per Session: 90 min  Stress: Stress Concern Present (09/14/2021)   [employer] - Occupational Stress Questionnaire    Feeling of Stress : To some extent  Social Connections:  Moderately Integrated (09/14/2021)   Social Connection and Isolation Panel [NHANES]    Frequency of Communication with Friends and Family: More than three times a week    Frequency of Social Gatherings with Friends and Family: Three times a week    Attends Religious Services: More than 4 times per year    Active Member of Clubs or Organizations: No    Attends BankerClub or Organization Meetings: Never    Marital Status: Living with partner  Intimate Partner Violence: Not At Risk (09/14/2021)   Humiliation, Afraid, Rape, and Kick questionnaire    Fear of Current or Ex-Partner: No    Emotionally Abused: No    Physically Abused: No    Sexually Abused: No    Outpatient Medications Prior to Visit  Medication Sig Dispense Refill   amoxicillin-clavulanate (AUGMENTIN) 875-125 MG tablet Take 1 tablet by mouth 2 (two) times daily. 20 tablet 0   butalbital-acetaminophen-caffeine (FIORICET) 50-325-40 MG tablet Take 1 tablet by mouth every 6 (six) hours as needed for headache. 14 tablet 0   methocarbamol (ROBAXIN) 500 MG tablet Take one po qhs prn muscle spasm 20 tablet 0   metroNIDAZOLE (FLAGYL) 500 MG tablet Take 1 tablet (500 mg total) by mouth 2 (two) times daily for 7 days. 14 tablet 0   sertraline (ZOLOFT) 100 MG tablet Take 1 tablet (100 mg total) by mouth daily. 30 tablet 3   labetalol (NORMODYNE) 100 MG tablet Take 1 tablet (100 mg total) by mouth 2 (two) times daily. (Patient taking differently: Take 200 mg by mouth 2 (two) times daily.) 180 tablet 1   No facility-administered medications prior to visit.        Objective:    Physical Exam  Gen: NAD, resting comfortably HEENT: TMs normal bilaterally. OP clear. No thyromegaly noted.  CV: RRR with no murmurs appreciated Pulm: NWOB, CTAB with no crackles, wheezes, or rhonchi GI: Normal bowel sounds present. Soft, Nontender, Nondistended. MSK: no edema, cyanosis, or clubbing noted Skin: warm, dry Psych: Normal affect and thought  content  BP 108/62   Pulse 74   Temp (!) 97.4 F (36.3 C)   Resp 12   Ht 5\' 3"  (1.6 m)   Wt 204 lb 4 oz (92.6 kg)   LMP 08/01/2021 (Approximate)   SpO2 99%   BMI 36.18 kg/m  Wt Readings from Last 3 Encounters:  10/29/21 204 lb 4 oz (92.6 kg)  10/23/21 205 lb 3 oz (93.1 kg)  09/07/21 199 lb 9 oz (90.5 kg)  Health Maintenance Due  Topic Date Due   CHLAMYDIA SCREENING  Never done   HIV Screening  Never done   Hepatitis C Screening  Never done    There are no preventive care reminders to display for this patient.  Lab Results  Component Value Date   TSH 1.03 07/30/2021   Lab Results  Component Value Date   WBC 6.3 07/30/2021   HGB 14.4 07/30/2021   HCT 41.3 07/30/2021   MCV 83.1 07/30/2021   PLT 312.0 07/30/2021   Lab Results  Component Value Date   NA 138 07/30/2021   K 4.1 07/30/2021   CO2 27 07/30/2021   GLUCOSE 89 07/30/2021   BUN 10 07/30/2021   CREATININE 0.74 07/30/2021   BILITOT 0.7 01/02/2021   ALKPHOS 42 01/02/2021   AST 19 01/02/2021   ALT 23 01/02/2021   PROT 7.4 01/02/2021   ALBUMIN 4.4 01/02/2021   CALCIUM 9.6 07/30/2021   ANIONGAP 6 01/02/2021   GFR 116.73 07/30/2021   No results found for: "CHOL" No results found for: "HDL" No results found for: "LDLCALC" No results found for: "TRIG" No results found for: "CHOLHDL" Lab Results  Component Value Date   HGBA1C 5.4 10/29/2021      Assessment & Plan:   Problem List Items Addressed This Visit       Cardiovascular and Mediastinum   Essential (primary) hypertension   Relevant Medications   labetalol (NORMODYNE) 200 MG tablet     Endocrine   PCOS (polycystic ovarian syndrome) - Primary    Did d/w pt might want to try progesterone only ocps and or other options Pt can't try metformin because she had intolerance.  Can consider iud with gyn referral Reviewed past dhea, low.  Pt is interested in weight loss options to help with pcos.  Will consider mounjaro, sending rx       Relevant Medications   tirzepatide (MOUNJARO) 2.5 MG/0.5ML Pen   Other Relevant Orders   POCT glycosylated hemoglobin (Hb A1C) (Completed)   Ambulatory referral to Obstetrics / Gynecology   Ambulatory referral to Endocrinology   Amb ref to Medical Nutrition Therapy-MNT   Insulin resistance    a1c in office today to determine if prediabetic Reviewed, pt is not.  Will consider mounjaro for weight loss      Relevant Medications   tirzepatide (MOUNJARO) 2.5 MG/0.5ML Pen   Other Relevant Orders   POCT glycosylated hemoglobin (Hb A1C) (Completed)   Ambulatory referral to Obstetrics / Gynecology   Ambulatory referral to Endocrinology   Amb ref to Medical Nutrition Therapy-MNT     Other   Class 2 severe obesity with serious comorbidity in adult [facility])   Relevant Medications   tirzepatide (MOUNJARO) 2.5 MG/0.5ML Pen   Other Relevant Orders   Ambulatory referral to Endocrinology   Amb ref to Medical Nutrition Therapy-MNT   Weight gain   Relevant Medications   tirzepatide (MOUNJARO) 2.5 MG/0.5ML Pen   Other Relevant Orders   POCT glycosylated hemoglobin (Hb A1C) (Completed)   Ambulatory referral to Obstetrics / Gynecology   Ambulatory referral to Endocrinology   Amb ref to Medical Nutrition Therapy-MNT    Meds ordered this encounter  Medications   tirzepatide [facility]) 2.5 MG/0.5ML Pen    Sig: Inject 2.5 mg into the skin once a week.    Dispense:  2 mL    Refill:  0    Order Specific Question:   Supervising Provider    Answer:   Ermalene Searing, AMY E [  [id number]]   labetalol (NORMODYNE) 200 MG tablet    Sig: Take 1 tablet (200 mg total) by mouth 2 (two) times daily.    Order Specific Question:   Supervising Provider    Answer:   Kerby Nora E [[id number]]    Follow-up: Return in about 6 months (around 04/30/2022) for regular follow up appointment .    Mort Sawyers, FNP

## 2021-11-02 ENCOUNTER — Encounter: Payer: Self-pay | Admitting: Obstetrics and Gynecology

## 2021-11-07 ENCOUNTER — Telehealth: Payer: Self-pay

## 2021-11-07 NOTE — Telephone Encounter (Signed)
I was unable to start prior auth on Cover my meds.  I called [employer] at [phone] for prior auth on Mounjaro.  Per Selena Batten at [facility], weight modification medications are not covered under this plan.

## 2021-11-07 NOTE — Telephone Encounter (Signed)
Noted  

## 2021-11-07 NOTE — Telephone Encounter (Signed)
Prior auth started for [facility] 2.5MG /0.5ML pen-injectors.  On Cover my meds, it says reversal by [facility] pharmacy.  I called them to check on reversal.  They said this was reversed because patient wanted to use a pharmacy discount card, but the medication was still too expensive.  I am checking with patient through Mychart to see if she has current insurance coverage to send a new prior auth for this.

## 2021-11-13 ENCOUNTER — Other Ambulatory Visit: Payer: Self-pay | Admitting: Family

## 2021-11-13 DIAGNOSIS — E282 Polycystic ovarian syndrome: Secondary | ICD-10-CM

## 2021-11-13 DIAGNOSIS — E8881 Metabolic syndrome: Secondary | ICD-10-CM

## 2021-11-13 DIAGNOSIS — R635 Abnormal weight gain: Secondary | ICD-10-CM

## 2021-11-14 ENCOUNTER — Telehealth: Payer: Self-pay

## 2021-11-14 NOTE — Telephone Encounter (Signed)
Prior auth started for [facility] 2.5MG /0.5ML pen-injectors. Francee Piccolo Key: [id number] - [place] Case ID: [id number] Waiting for determination.

## 2021-11-21 NOTE — Telephone Encounter (Signed)
Noted forwarded to Tabitha as well.

## 2021-11-21 NOTE — Telephone Encounter (Signed)
I received a fax from [facility] that states that the prior auth for [facility] was cancelled.  They do not allow electronic Pas for this.  I spoke to [place] at [employer] fully-insured plans at ph # [phone].  Per Misty Stanley at [employer], patient's plan requires review.  Their requirement is a fax to request review of Mounjaro, amt prescribed, and frequency prescribed along with medical records.   Fax to:  Attn:  Case Management, Fax # [phone].  Allow 10 - 15 business days for a response.

## 2021-11-23 ENCOUNTER — Encounter: Payer: Self-pay | Admitting: Obstetrics and Gynecology

## 2021-11-26 NOTE — Telephone Encounter (Signed)
I realized that on the last note I sent to you that I did not mention that Tabitha would have to create a letter to state that there needs to be a review of the medication along with the amt prescribed and frequency prescribed along with medical records for Ms Fifita.  Please let me know if I need to do anything.

## 2021-12-03 ENCOUNTER — Telehealth: Payer: Self-pay

## 2021-12-03 NOTE — Telephone Encounter (Signed)
I spoke with pt; pt said about 2:15 pt took labetalol 200mg  two tabs (pt said she had spoken with T Dugal FNP about one month ago and was advised to increase Labetalol 200mg  from 1 tab bid to two tabs bid) and sertraline. About 2:45 pt was driving to work and realized her tongue was numb due to pt was eating and could not feel food on tongue. Pt then developed  CP and SOB. Pt got to work and relaxed and as of 5 PM no CP,SOB or numbness in tongue or lips. Pt has no symptoms now. Pt thinks might be related to taking labetalol. 2:15 was pts first dose of labetalol today. Pt has not taken BP today. Pt took BP last few days ago was in 130's/ ?  T Dugal FNP has already left office but Dr was in office and he said pt needs to ck BP if elevated to take 2nd dose of Labetalol tonight but if BP not elevated and pt thinks symptoms earlier related to BP then can skip 2nd dose tonight until sees PCP on 12/04/21 at 7:40 AM. UC & ED precautions given and pt voiced understanding. 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 Sending note to T Dugal FNP, Dr Reece Agar and 12/06/21 CMA.

## 2021-12-03 NOTE — Telephone Encounter (Signed)
Patient is returning call, states she is at work, if call please leave a voicemail on her phone: [phone]

## 2021-12-03 NOTE — Telephone Encounter (Signed)
[  4:39 PM] Mort Sawyers  MRN: [id number] I am so sorry can you triage? I have another as well. I have two seemingly urgents on my schedule tomorrow. Just now taking a look at tomorrow.    Unable to reach pt by phone and new phone # [phone] given to me by pts mom does not have v/m set up yet and no answer. I asked pts mom if she spoke with pt and I had not spoke with pt for pt to call [facility] even if after 5 PM for triage of symptoms. Pts mom voiced understanding and would tell pt. Sending note to T Dugal FNP, Tresa Endo CMA and Amy front office mgr.

## 2021-12-03 NOTE — Telephone Encounter (Signed)
Will need to verify that she's taking labetalol 200mg  twice daily.  We also discussed going to ER if any recurrent neurological symptoms.

## 2021-12-04 ENCOUNTER — Ambulatory Visit (INDEPENDENT_AMBULATORY_CARE_PROVIDER_SITE_OTHER): Payer: Self-pay | Admitting: Family

## 2021-12-04 ENCOUNTER — Encounter: Payer: Self-pay | Admitting: Family

## 2021-12-04 VITALS — BP 112/78 | HR 73 | Temp 98.7°F | Resp 16 | Ht 63.0 in | Wt 203.0 lb

## 2021-12-04 DIAGNOSIS — S199XXA Unspecified injury of neck, initial encounter: Secondary | ICD-10-CM

## 2021-12-04 DIAGNOSIS — I10 Essential (primary) hypertension: Secondary | ICD-10-CM

## 2021-12-04 DIAGNOSIS — T782XXA Anaphylactic shock, unspecified, initial encounter: Secondary | ICD-10-CM | POA: Insufficient documentation

## 2021-12-04 DIAGNOSIS — M62838 Other muscle spasm: Secondary | ICD-10-CM

## 2021-12-04 MED ORDER — LABETALOL HCL 200 MG PO TABS: 200.0000 mg | ORAL_TABLET | Freq: Two times a day (BID) | ORAL | 0 refills | Status: DC

## 2021-12-04 MED ORDER — EPINEPHRINE 0.3 MG/0.3ML IJ SOAJ: 0.3000 mg | INTRAMUSCULAR | 0 refills | Status: AC | PRN

## 2021-12-04 NOTE — Telephone Encounter (Signed)
Pt no longer utilizing this insurance.  She is pending insurance card for new insurance. Will send out again once new insurance set up.

## 2021-12-04 NOTE — Progress Notes (Signed)
Established Patient Office Visit  Subjective:  Patient ID: Valerie Hayes, female    DOB: Nov 20, 2000  Age: 21 y.o. MRN: [id number]  CC:  Chief Complaint  Patient presents with   Numbness    Started yesterday. Started at tongue moved to mouth and made chest hurt.   Medication Problem    HPI Valerie Hayes is here today with concerns.   HTN: pt states yesterday about thirty minutes after taking labetolol she started to notice numbness on her tongue. She then states progressed to some sob , felt like she couldn't take a deep breath. She was able to relax for a few minutes at work and after about thirty minutes the sob had resolved. The tongue numbness lasted until about ten pm last night. Since her refill one week ago, they gave her the generic pill that has yellow coating. She was taking white tablets the week prior.   Today without any symptoms, but has not taken labetolol yet this am.   Past Medical History:  Diagnosis Date   Chronic tension headaches    Hypertension    PCOS (polycystic ovarian syndrome)     Past Surgical History:  Procedure Laterality Date   LIPOMA RESECTION     MOUTH SURGERY     expose a teeth that would'nt come down   TONSILLECTOMY AND ADENOIDECTOMY      Family History  Problem Relation Age of Onset   Heart disease Mother    Diabetes Father    Other Sister        pituitary gland tumor - benign   Diabetes Maternal Grandmother    Cancer Maternal Grandmother        colon/rectal; uterine 17yrs ago   Heart disease Maternal Grandfather    Cancer Maternal Grandfather        colon   Diabetes Paternal Grandmother    Cancer Paternal Grandmother        colon/rectal   Diabetes Paternal Grandfather    Heart disease Paternal Grandfather     Social History   Socioeconomic History   Marital status: Single    Spouse name: Not on file   Number of children: 0   Years of education: 14.5   Highest education level: Not on file  Occupational History    Occupation: Theatre stage manager  Tobacco Use   Smoking status: Never   Smokeless tobacco: Never  Vaping Use   Vaping Use: Never used  Substance and Sexual Activity   Alcohol use: Never   Drug use: Never   Sexual activity: Yes    Partners: Male    Birth control/protection: Coitus interruptus  Other Topics Concern   Not on file  Social History Narrative   Not on file   Social Determinants of Health   Financial Resource Strain: Low Risk  (09/14/2021)   Overall Financial Resource Strain (CARDIA)    Difficulty of Paying Living Expenses: Not hard at all  Food Insecurity: No Food Insecurity (09/14/2021)   Hunger Vital Sign    Worried About Running Out of Food in the Last Year: Never true    Ran Out of Food in the Last Year: Never true  Transportation Needs: No Transportation Needs (09/14/2021)   PRAPARE - Administrator, Civil Service (Medical): No    Lack of Transportation (Non-Medical): No  Physical Activity: Sufficiently Active (09/14/2021)   Exercise Vital Sign    Days of Exercise per Week: 4 days    Minutes of Exercise per  Session: 90 min  Stress: Stress Concern Present (09/14/2021)   [employer] of Occupational Health - Occupational Stress Questionnaire    Feeling of Stress : To some extent  Social Connections: Moderately Integrated (09/14/2021)   Social Connection and Isolation Panel [NHANES]    Frequency of Communication with Friends and Family: More than three times a week    Frequency of Social Gatherings with Friends and Family: Three times a week    Attends Religious Services: More than 4 times per year    Active Member of Clubs or Organizations: No    Attends Banker Meetings: Never    Marital Status: Living with partner  Intimate Partner Violence: Not At Risk (09/14/2021)   Humiliation, Afraid, Rape, and Kick questionnaire    Fear of Current or Ex-Partner: No    Emotionally Abused: No    Physically Abused: No    Sexually Abused: No     Outpatient Medications Prior to Visit  Medication Sig Dispense Refill   butalbital-acetaminophen-caffeine (FIORICET) 50-325-40 MG tablet Take 1 tablet by mouth every 6 (six) hours as needed for headache. 14 tablet 0   methocarbamol (ROBAXIN) 500 MG tablet Take one po qhs prn muscle spasm 20 tablet 0   sertraline (ZOLOFT) 100 MG tablet Take 1 tablet (100 mg total) by mouth daily. 30 tablet 3   labetalol (NORMODYNE) 200 MG tablet Take 1 tablet (200 mg total) by mouth 2 (two) times daily.     amoxicillin-clavulanate (AUGMENTIN) 875-125 MG tablet Take 1 tablet by mouth 2 (two) times daily. (Patient not taking: Reported on 12/04/2021) 20 tablet 0   tirzepatide (MOUNJARO) 2.5 MG/0.5ML Pen Inject 2.5 mg into the skin once a week. (Patient not taking: Reported on 12/04/2021) 2 mL 0   No facility-administered medications prior to visit.    Allergies  Allergen Reactions   Yellow Dyes (Non-Tartrazine) Anaphylaxis   Metformin And Related Nausea And Vomiting   Fexofenadine-Pseudoephed Er Nausea And Vomiting    All allergy medications cause nausea and vomiting        Objective:    Physical Exam Constitutional:      Appearance: Normal appearance. She is obese.  Neurological:     Mental Status: She is alert.     BP 112/78   Pulse 73   Temp 98.7 F (37.1 C)   Resp 16   Ht 5\' 3"  (1.6 m)   Wt 203 lb (92.1 kg)   LMP 08/01/2021 (Approximate)   SpO2 98%   BMI 35.96 kg/m  Wt Readings from Last 3 Encounters:  12/04/21 203 lb (92.1 kg)  10/29/21 204 lb 4 oz (92.6 kg)  10/23/21 205 lb 3 oz (93.1 kg)     Health Maintenance Due  Topic Date Due   CHLAMYDIA SCREENING  Never done   HIV Screening  Never done   Hepatitis C Screening  Never done    There are no preventive care reminders to display for this patient.  Lab Results  Component Value Date   TSH 1.03 07/30/2021   Lab Results  Component Value Date   WBC 6.3 07/30/2021   HGB 14.4 07/30/2021   HCT 41.3 07/30/2021   MCV  83.1 07/30/2021   PLT 312.0 07/30/2021   Lab Results  Component Value Date   NA 138 07/30/2021   K 4.1 07/30/2021   CO2 27 07/30/2021   GLUCOSE 89 07/30/2021   BUN 10 07/30/2021   CREATININE 0.74 07/30/2021   BILITOT 0.7 01/02/2021   ALKPHOS  42 01/02/2021   AST 19 01/02/2021   ALT 23 01/02/2021   PROT 7.4 01/02/2021   ALBUMIN 4.4 01/02/2021   CALCIUM 9.6 07/30/2021   ANIONGAP 6 01/02/2021   GFR 116.73 07/30/2021   Lab Results  Component Value Date   HGBA1C 5.4 10/29/2021      Assessment & Plan:   Problem List Items Addressed This Visit       Cardiovascular and Mediastinum   Essential (primary) hypertension    Suspect that the reaction was due to the change with yellow tablets, yellow dye possible allergy.  RX given for epi pen, however advised pharmacy WHITE pills only.  Pt to take white pill at home as directed, still labetolol 200 mg twice daily.  Pt with epi pen and benadryl as needed Advised of 911 vs ER precautions       Relevant Medications   labetalol (NORMODYNE) 200 MG tablet   EPINEPHrine 0.3 mg/0.3 mL IJ SOAJ injection     Other   Muscle spasm    Pt to take robaxin already prescribed Heat to site Exercises to neck as tolerated      Neck injury, initial encounter    On physical exam suspected neck sprain with muscle spasm       Anaphylactic syndrome - Primary    Possible to yellow dye Epi pen RX given  Pt no longer to take  Yellow dye tablets of labetolol       Relevant Medications   EPINEPHrine 0.3 mg/0.3 mL IJ SOAJ injection    Meds ordered this encounter  Medications   labetalol (NORMODYNE) 200 MG tablet    Sig: Take 1 tablet (200 mg total) by mouth 2 (two) times daily.    Dispense:  180 tablet    Refill:  0    Only give WHITE pills Allergic reaction to yellow dye in pill    Order Specific Question:   Supervising Provider    Answer:   Ermalene Searing, AMY E [[id number]]   EPINEPHrine 0.3 mg/0.3 mL IJ SOAJ injection    Sig: Inject 0.3 mg into  the muscle as needed for anaphylaxis.    Dispense:  1 each    Refill:  0    Order Specific Question:   Supervising Provider    Answer:   BEDSOLE, AMY E [[id number]]    Follow-up: No follow-ups on file.    Mort Sawyers, FNP

## 2021-12-04 NOTE — Assessment & Plan Note (Signed)
Possible to yellow dye Epi pen RX given  Pt no longer to take  Yellow dye tablets of labetolol

## 2021-12-04 NOTE — Assessment & Plan Note (Signed)
Pt to take robaxin already prescribed Heat to site Exercises to neck as tolerated

## 2021-12-04 NOTE — Assessment & Plan Note (Signed)
Suspect that the reaction was due to the change with yellow tablets, yellow dye possible allergy.  RX given for epi pen, however advised pharmacy WHITE pills only.  Pt to take white pill at home as directed, still labetolol 200 mg twice daily.  Pt with epi pen and benadryl as needed Advised of 911 vs ER precautions

## 2021-12-04 NOTE — Telephone Encounter (Signed)
Thank you Dr. Reece Agar for your consult.  Saw patient in office today. Pt stable.  Appreciate both of your recommendations.

## 2021-12-04 NOTE — Telephone Encounter (Signed)
Seen pt in office today.

## 2021-12-04 NOTE — Assessment & Plan Note (Signed)
On physical exam suspected neck sprain with muscle spasm

## 2021-12-13 ENCOUNTER — Encounter: Payer: Medicaid Other | Admitting: Advanced Practice Midwife

## 2021-12-27 ENCOUNTER — Encounter: Payer: Medicaid Other | Admitting: Obstetrics and Gynecology

## 2021-12-27 ENCOUNTER — Encounter: Payer: Self-pay | Admitting: Obstetrics and Gynecology

## 2021-12-27 DIAGNOSIS — Z3482 Encounter for supervision of other normal pregnancy, second trimester: Secondary | ICD-10-CM

## 2021-12-27 DIAGNOSIS — Z7689 Persons encountering health services in other specified circumstances: Secondary | ICD-10-CM

## 2021-12-27 DIAGNOSIS — Z3A21 21 weeks gestation of pregnancy: Secondary | ICD-10-CM

## 2022-01-07 ENCOUNTER — Telehealth: Payer: Self-pay

## 2022-01-07 NOTE — Telephone Encounter (Signed)
Patient called in stating that she was prescribed muscle relaxer to help with back spasms, but is currently at work with one and wants to know what she can do or take to help.

## 2022-02-15 IMAGING — US US PELVIS COMPLETE WITH TRANSVAGINAL
1 series · 14 of 25 positions shown · non-contrast
Comparison: None

CLINICAL DATA: Secondary amenorrhea, LMP 05/20/2020



[Series 1: us pelvis complete with transvaginal · 0.22mm/px · 89 acquisitions, 14 frames shown]
[im 1/89]
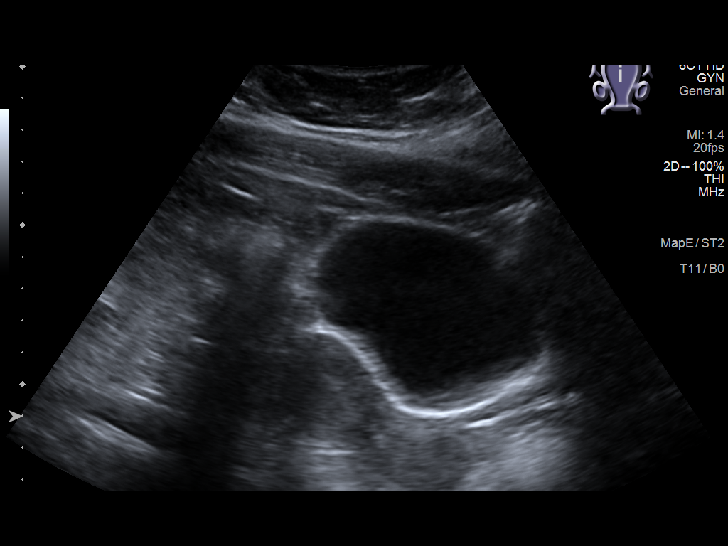
[im 8/89]
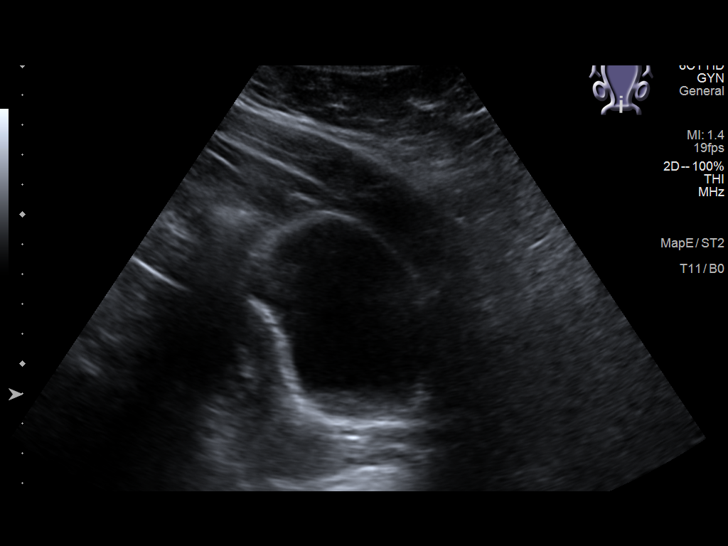
[im 15/89]
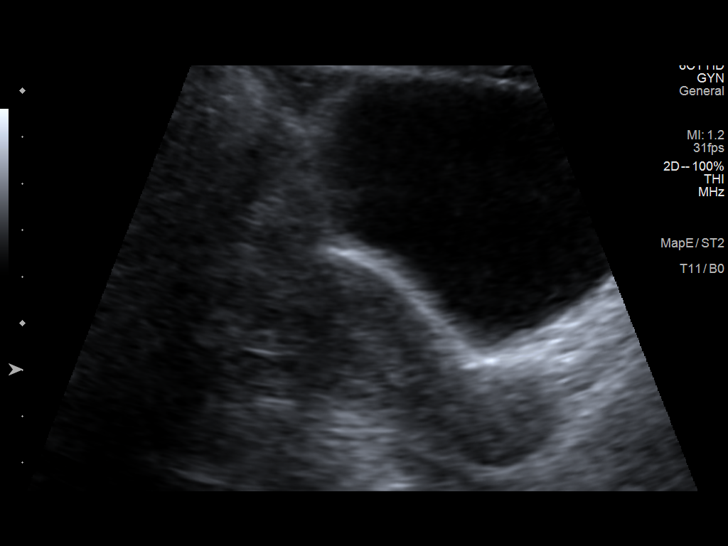
[im 23/89]
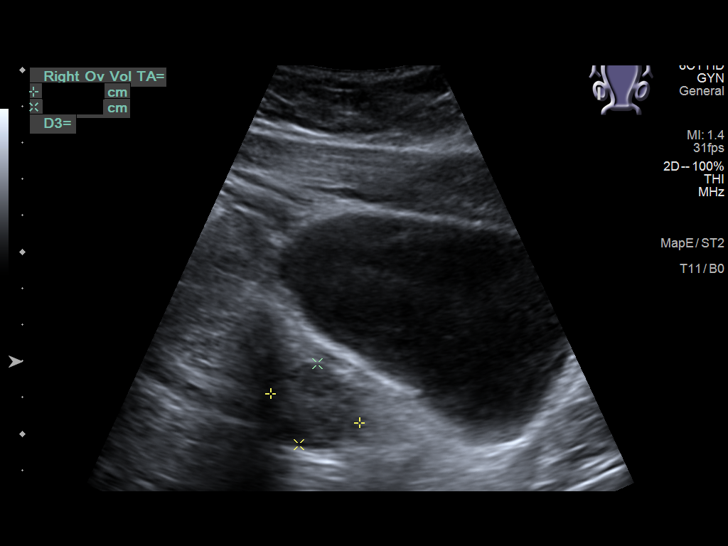
[im 30/89]
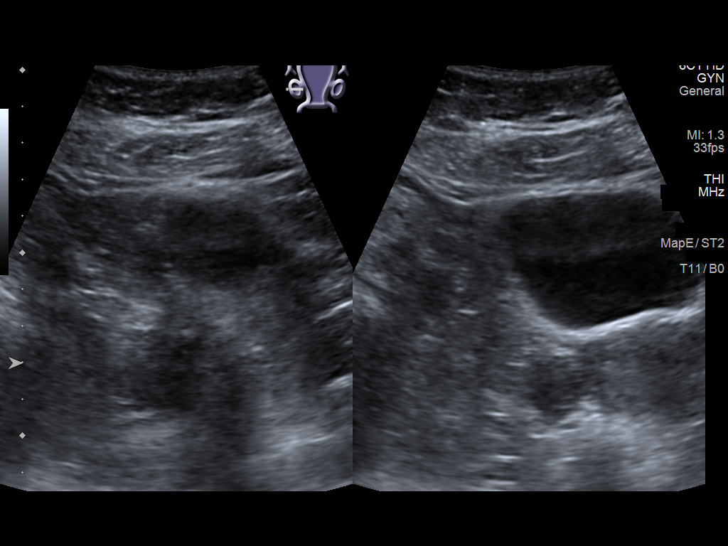
[im 34/89]
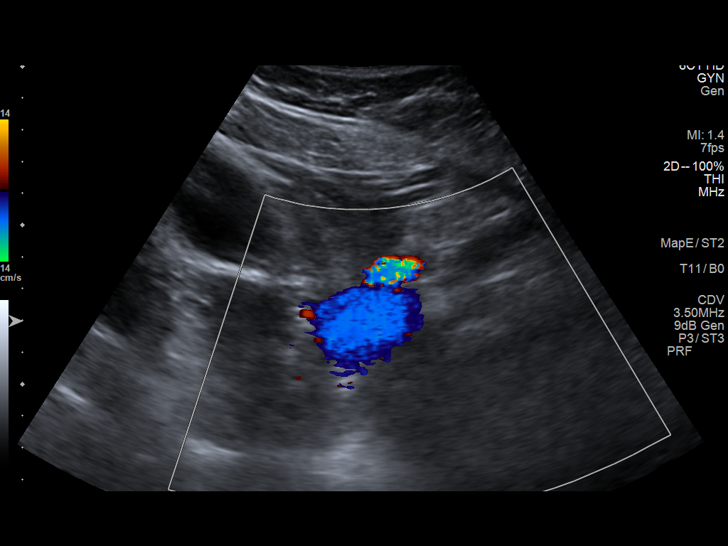
[im 41/89]
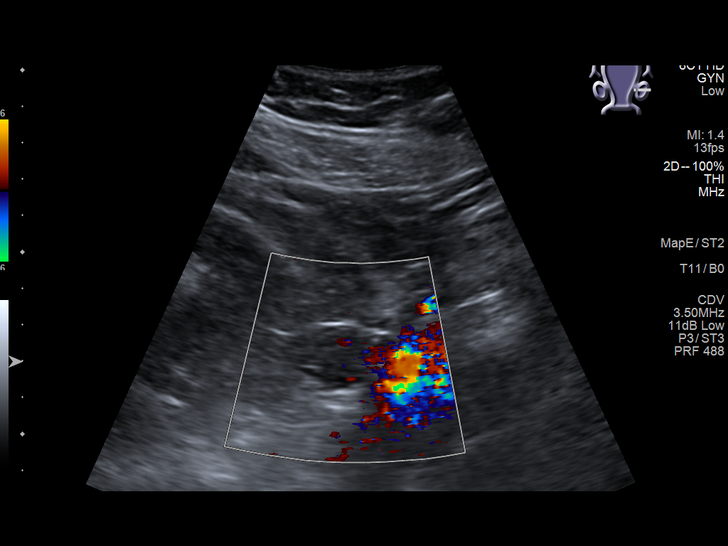
[im 48/89]
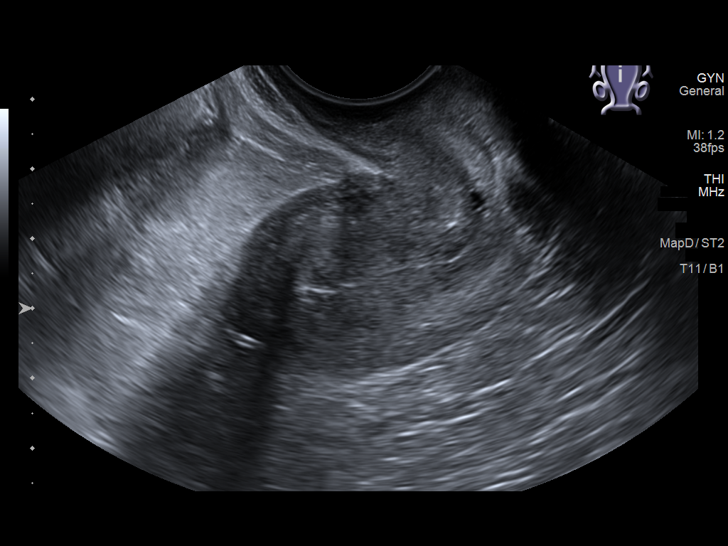
[im 56/89]
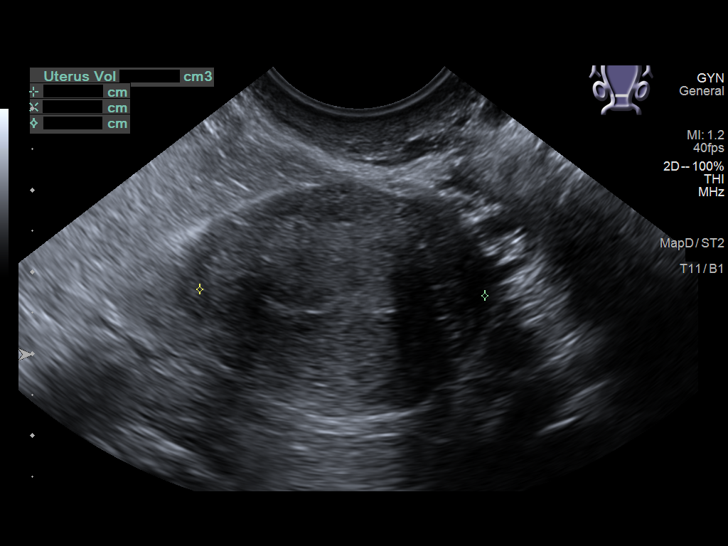
[im 59/89]
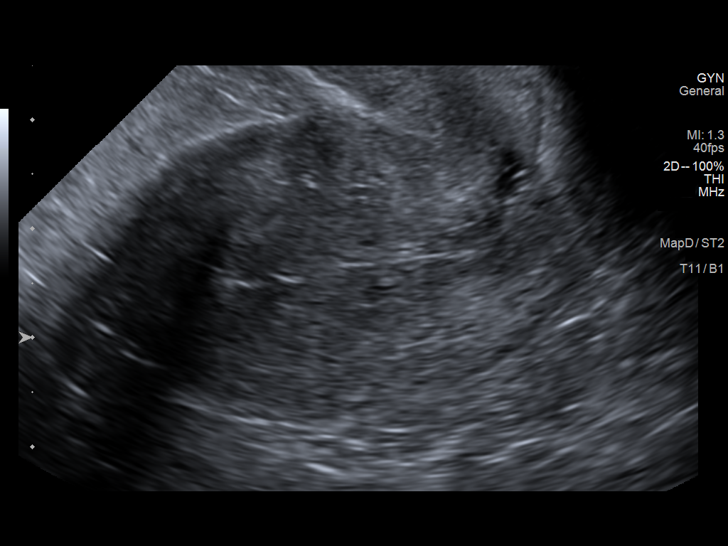
[im 67/89]
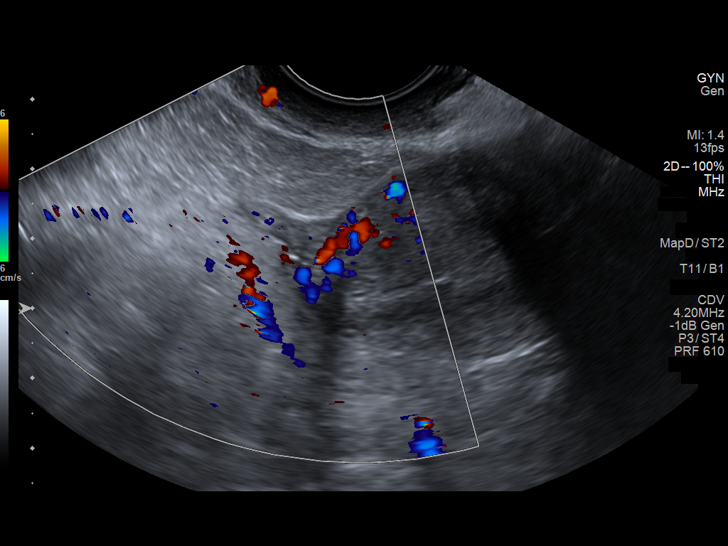
[im 74/89]
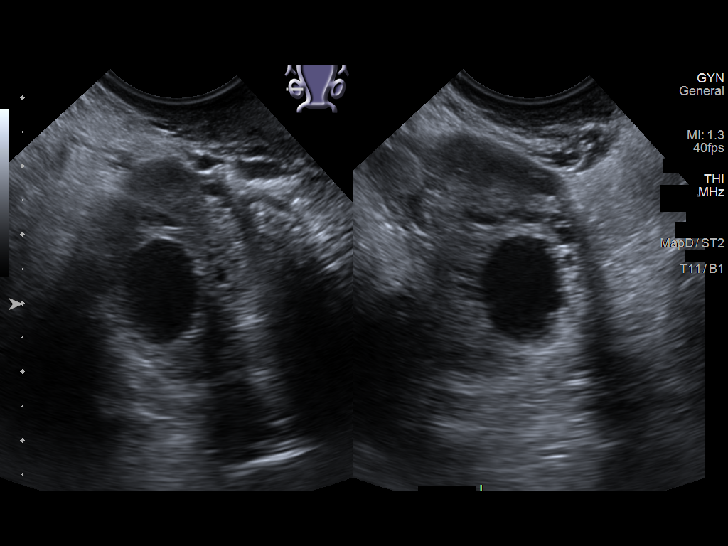
[im 81/89]
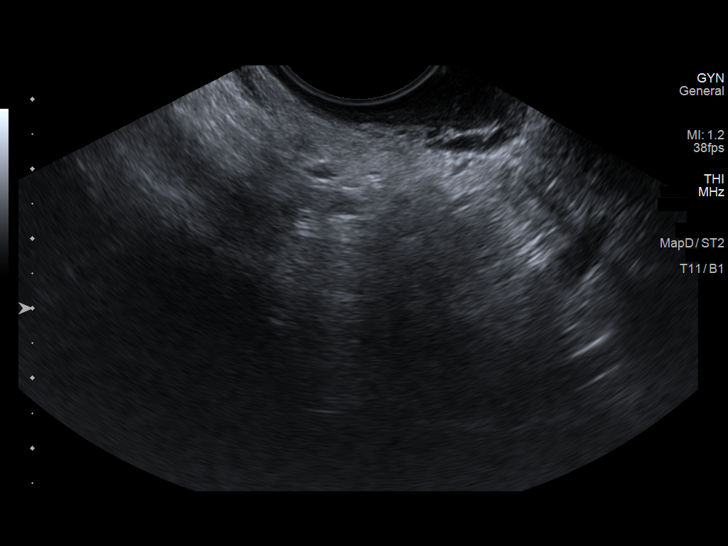
[im 89/89]
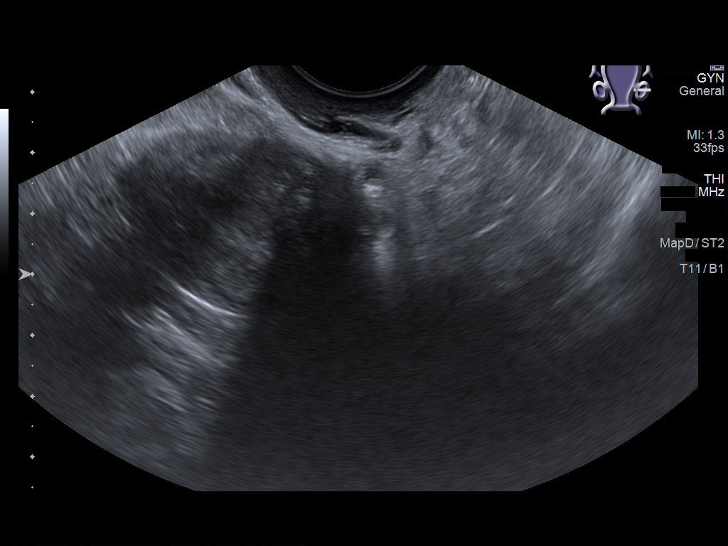

[14 of 25 positions shown; findings below may reference images not displayed]

FINDINGS: Uterus

Measurements: 5.3 x 2.7 x 3.5 cm = volume: 27 mL. Anteflexed.
Slightly heterogeneous myometrium. No focal mass.

Endometrium

Thickness: 6 mm.  No endometrial fluid or focal abnormality

Right ovary

Measurements: 2.6 x 2.3 x 2.0 cm = volume: 6.1 mL. Normal morphology
without mass

Left ovary

Measurements: 3.0 x 1.9 x 2.3 cm = volume: 6.7 mL. Normal morphology
without mass

Other findings

No free pelvic fluid.  No adnexal masses.
IMPRESSION: Normal exam.

## 2022-03-02 IMAGING — CT CT RENAL STONE PROTOCOL
2 of 4 series · 16 of 46 positions shown, 18 images · non-contrast
Comparison: None

CLINICAL DATA: Sharp LEFT upper quadrant pain starting today,
nausea since yesterday, question kidney stone

EXAM:
CT ABDOMEN AND PELVIS WITHOUT CONTRAST
TECHNIQUE: Multidetector CT imaging of the abdomen and pelvis was performed
following the standard protocol without IV contrast. Sagittal and
coronal MPR images reconstructed from axial data set. No oral
contrast administered.

[Series 2: stone full standard · axial · 0.71mm/px · z∈[-875,-420]mm · 13 of 101 slices shown, 15 images]
[im 5/101  soft-tissue]
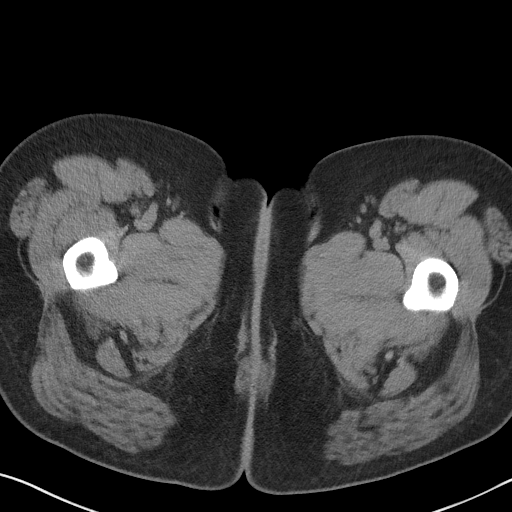
[im 5/101  bone]
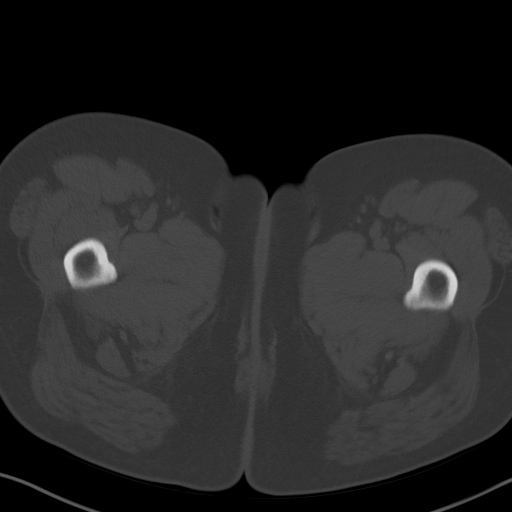
[im 14/101  soft-tissue]
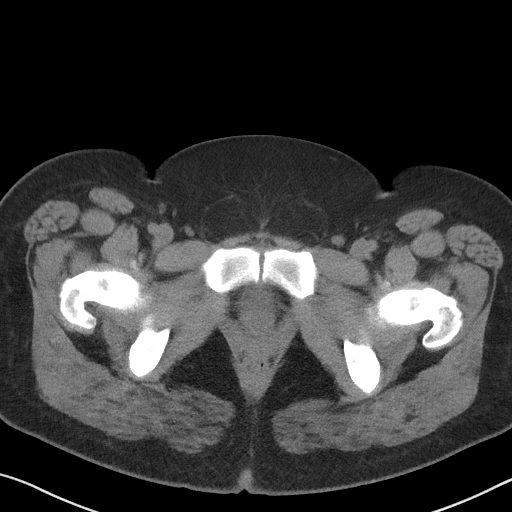
[im 22/101  soft-tissue]
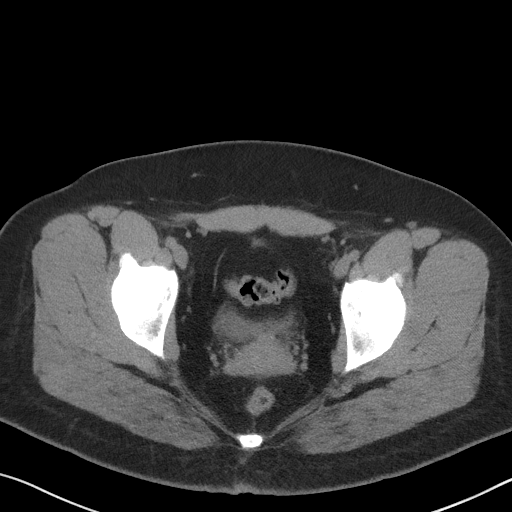
[im 27/101  soft-tissue]
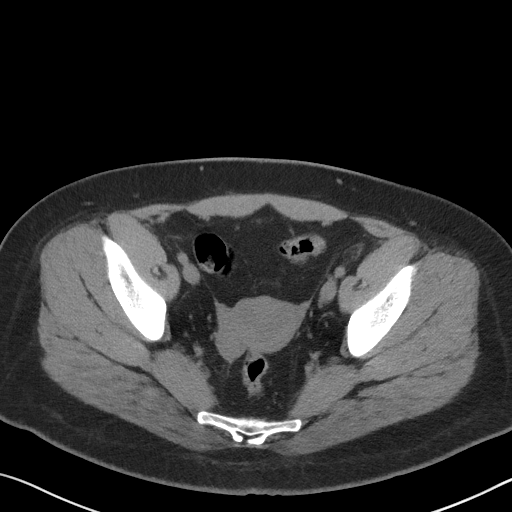
[im 35/101  soft-tissue]
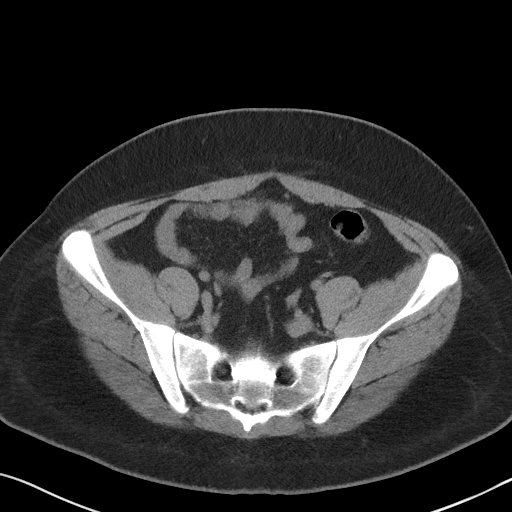
[im 44/101  soft-tissue]
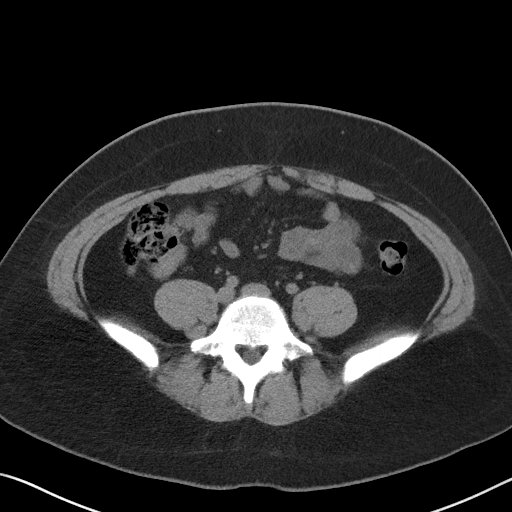
[im 53/101  soft-tissue]
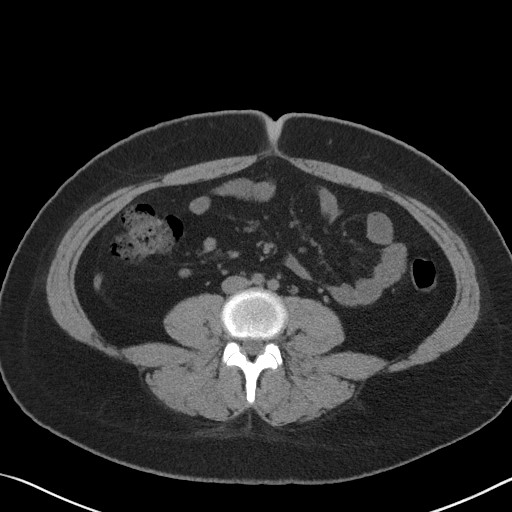
[im 57/101  soft-tissue]
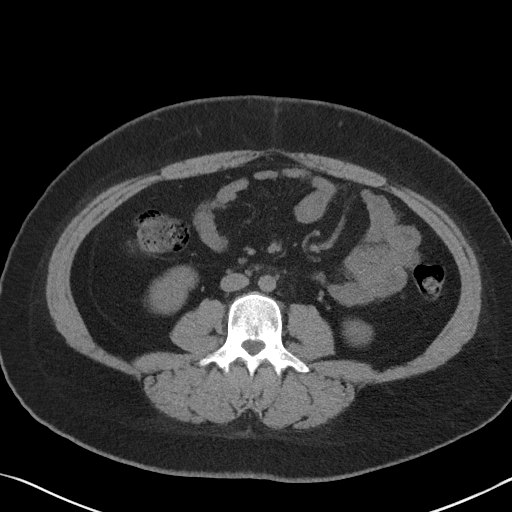
[im 66/101  soft-tissue]
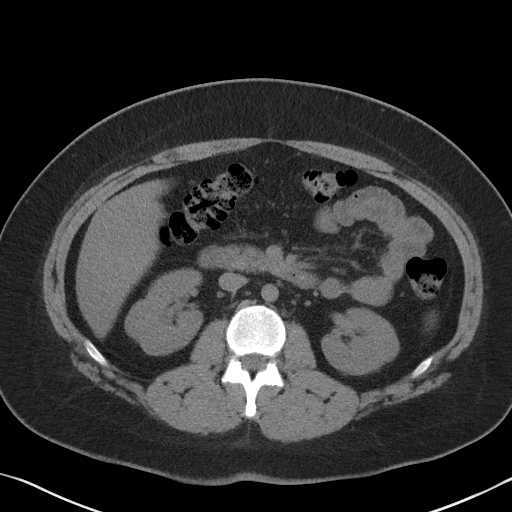
[im 66/101  bone]
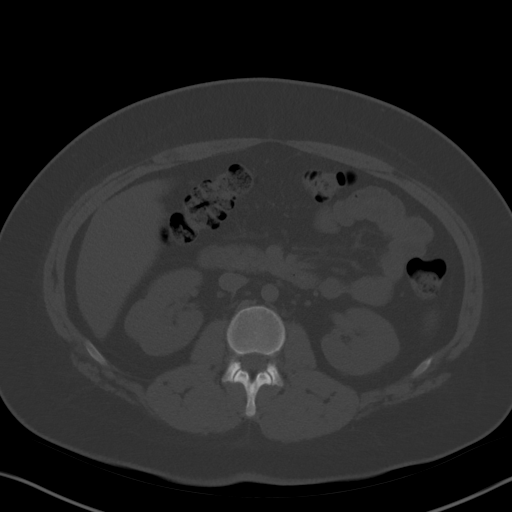
[im 74/101  soft-tissue]
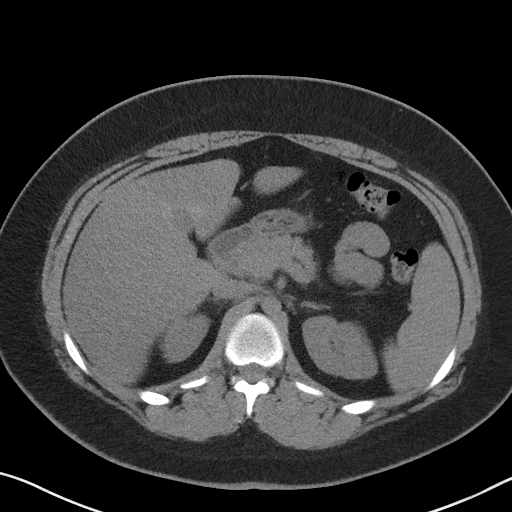
[im 79/101  soft-tissue]
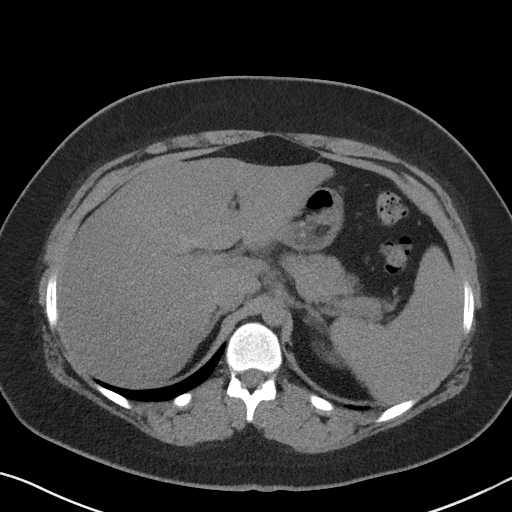
[im 87/101  soft-tissue]
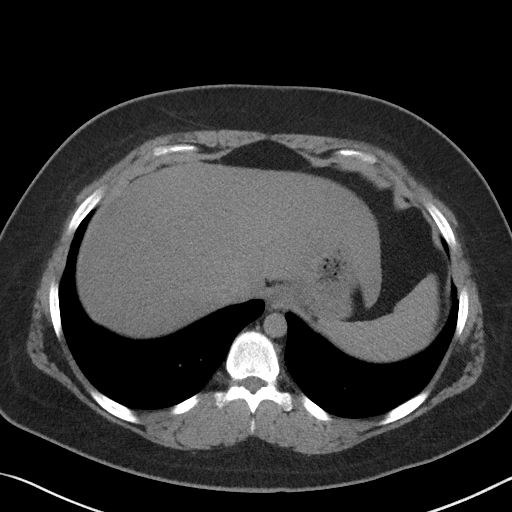
[im 96/101  soft-tissue]
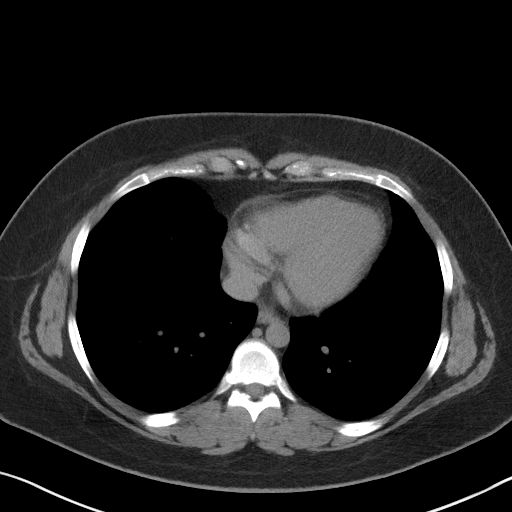

[Series 5: coronal · coronal · 0.71mm/px · 3 of 150 slices shown]
[im 50/150  soft-tissue]
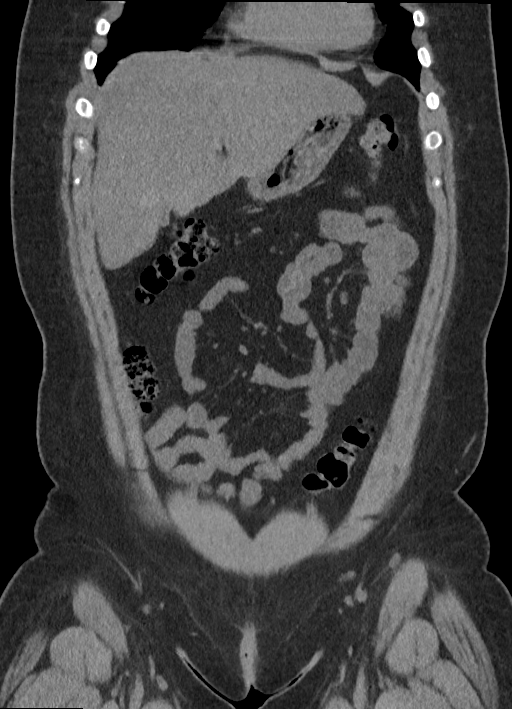
[im 67/150  soft-tissue]
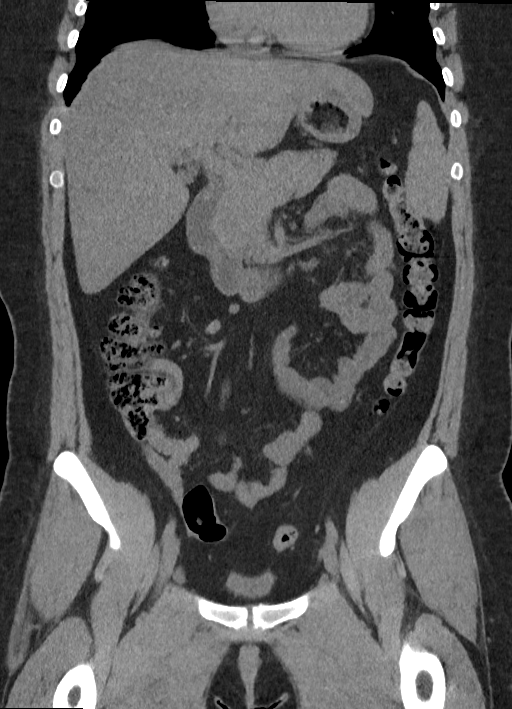
[im 83/150  soft-tissue]
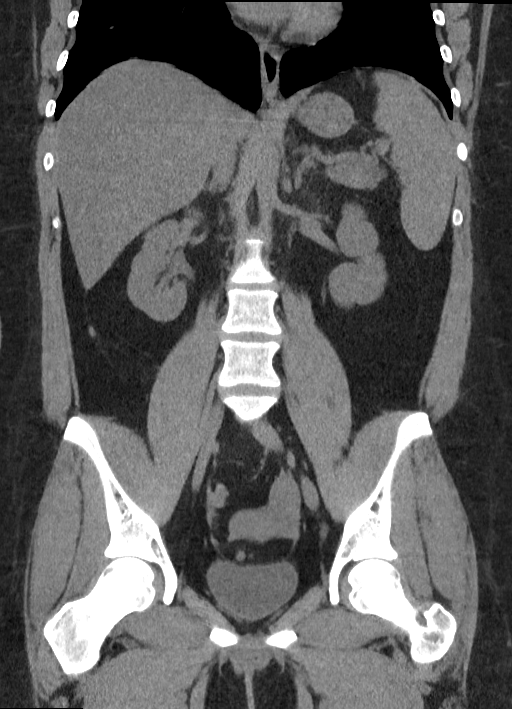

[16 of 46 positions shown; findings below may reference images not displayed]

FINDINGS: Lower chest: Lung bases clear

Hepatobiliary: Fatty infiltration of the liver diffusely with
minimal focal sparing adjacent to gallbladder fossa. No focal
hepatic mass. No calcified gallstones evident.

Pancreas: Normal appearance

Spleen: Normal appearance

Adrenals/Urinary Tract: Normal appearance of adrenal glands,
kidneys, ureters, and bladder. No urinary tract calcification or
dilatation.

Stomach/Bowel: Normal appendix. Stomach and bowel loops normal
appearance

Vascular/Lymphatic: Aorta normal caliber. No adenopathy. Few
scattered normal size mesenteric lymph nodes.

Reproductive: Uterus and ovaries unremarkable

Other: No free air or free fluid. No hernia or inflammatory process.

Musculoskeletal: Osseous structures unremarkable.
IMPRESSION: Fatty infiltration of liver.

Otherwise negative exam.

## 2022-03-25 ENCOUNTER — Telehealth: Payer: Medicaid Other | Admitting: Physician Assistant

## 2022-03-25 DIAGNOSIS — H66001 Acute suppurative otitis media without spontaneous rupture of ear drum, right ear: Secondary | ICD-10-CM

## 2022-03-25 MED ORDER — AMOXICILLIN 500 MG PO TABS: 500.0000 mg | ORAL_TABLET | Freq: Two times a day (BID) | ORAL | 0 refills | Status: DC

## 2022-03-25 MED ORDER — NEOMYCIN-POLYMYXIN-HC 3.5-10000-1 OT SOLN: 3.0000 [drp] | Freq: Four times a day (QID) | OTIC | 0 refills | Status: DC

## 2022-03-25 NOTE — Progress Notes (Signed)
Virtual Visit Consent   Valerie Hayes, you are scheduled for a virtual visit with a [facility] provider today. Just as with appointments in the office, your consent must be obtained to participate. Your consent will be active for this visit and any virtual visit you may have with one of our providers in the next 365 days. If you have a MyChart account, a copy of this consent can be sent to you electronically.  As this is a virtual visit, video technology does not allow for your provider to perform a traditional examination. This may limit your provider's ability to fully assess your condition. If your provider identifies any concerns that need to be evaluated in person or the need to arrange testing (such as labs, EKG, etc.), we will make arrangements to do so. Although advances in technology are sophisticated, we cannot ensure that it will always work on either your end or our end. If the connection with a video visit is poor, the visit may have to be switched to a telephone visit. With either a video or telephone visit, we are not always able to ensure that we have a secure connection.  By engaging in this virtual visit, you consent to the provision of healthcare and authorize for your insurance to be billed (if applicable) for the services provided during this visit. Depending on your insurance coverage, you may receive a charge related to this service.  I need to obtain your verbal consent now. Are you willing to proceed with your visit today? Valerie Hayes has provided verbal consent on 03/25/2022 for a virtual visit (video or telephone). Valerie Loveless, PA-C  Date: 03/25/2022 1:04 PM  Virtual Visit via Video Note   I, Valerie Hayes, connected with  Valerie Hayes  ([id number], 10-Dec-2000) on 03/25/22 at  1:00 PM EST by a video-enabled telemedicine application and verified that I am speaking with the correct person using two identifiers.  Location: Patient: Virtual Visit  Location Patient: Home Provider: Virtual Visit Location Provider: Home Office   I discussed the limitations of evaluation and management by telemedicine and the availability of in person appointments. The patient expressed understanding and agreed to proceed.    History of Present Illness: Valerie Hayes is a 21 y.o. who identifies as a female who was assigned female at birth, and is being seen today for otalgia.  HPI: Otalgia  There is pain in the left ear. This is a new problem. The current episode started yesterday. The problem occurs constantly. The problem has been rapidly worsening. Maximum temperature: 99.5. The fever has been present for Less than 1 day. The pain is mild. Associated symptoms include headaches and hearing loss (sounds fuzzy). Pertinent negatives include no coughing, ear discharge, neck pain, rhinorrhea or sore throat. Associated symptoms comments: Bloody discharge with blowing nose. She has tried NSAIDs for the symptoms. The treatment provided no relief. There is no history of a chronic ear infection, hearing loss or a tympanostomy tube.     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   Class 2 severe obesity with serious comorbidity in adult (HCC) 10/29/2021   Weight gain 10/29/2021   Insulin resistance 10/29/2021   Essential (primary) hypertension 09/07/2021   Chronic tension-type headache, intractable 09/07/2021   Seasonal allergic rhinitis 09/07/2021   Anxiety, generalized 09/07/2021   Migraine with aura and without  status migrainosus, not intractable 07/30/2021   PCOS (polycystic ovarian syndrome) 07/30/2021   Insomnia secondary to depression with anxiety 07/30/2021    Allergies:  Allergies  Allergen Reactions   Yellow Dyes (Non-Tartrazine) Anaphylaxis   Metformin And Related Nausea And Vomiting   Fexofenadine-Pseudoephed Er  Nausea And Vomiting    All allergy medications cause nausea and vomiting   Medications:  Current Outpatient Medications:    amoxicillin (AMOXIL) 500 MG tablet, Take 1 tablet (500 mg total) by mouth 2 (two) times daily., Disp: 20 tablet, Rfl: 0   neomycin-polymyxin-hydrocortisone (CORTISPORIN) OTIC solution, Place 3 drops into the right ear 4 (four) times daily. X 7 days, Disp: 10 mL, Rfl: 0   butalbital-acetaminophen-caffeine (FIORICET) 50-325-40 MG tablet, Take 1 tablet by mouth every 6 (six) hours as needed for headache., Disp: 14 tablet, Rfl: 0   EPINEPHrine 0.3 mg/0.3 mL IJ SOAJ injection, Inject 0.3 mg into the muscle as needed for anaphylaxis., Disp: 1 each, Rfl: 0   labetalol (NORMODYNE) 200 MG tablet, Take 1 tablet (200 mg total) by mouth 2 (two) times daily., Disp: 180 tablet, Rfl: 0   methocarbamol (ROBAXIN) 500 MG tablet, Take one po qhs prn muscle spasm, Disp: 20 tablet, Rfl: 0   sertraline (ZOLOFT) 100 MG tablet, Take 1 tablet (100 mg total) by mouth daily., Disp: 30 tablet, Rfl: 3  Observations/Objective: Patient is well-developed, well-nourished in no acute distress.  Resting comfortably at home.  Head is normocephalic, atraumatic.  No labored breathing.  Speech is clear and coherent with logical content.  Patient is alert and oriented at baseline.    Assessment and Plan: 1. Non-recurrent acute suppurative otitis media of right ear without spontaneous rupture of tympanic membrane - amoxicillin (AMOXIL) 500 MG tablet; Take 1 tablet (500 mg total) by mouth 2 (two) times daily.  Dispense: 20 tablet; Refill: 0 - neomycin-polymyxin-hydrocortisone (CORTISPORIN) OTIC solution; Place 3 drops into the right ear 4 (four) times daily. X 7 days  Dispense: 10 mL; Refill: 0  - Worsening symptoms that have not responded to OTC medications.  - Will give Amoxicillin and Cortisporin - Continue saline nasal rinses - Could consider to add Flonase (Fluticasone) nasal spray over the counter  for possible eustachian tube dysfunction - Steam and humidifier can help - Warm compress to ear - Stay well hydrated and get plenty of rest.  - Seek in person evaluation if no symptom improvement or if symptoms worsen   Follow Up Instructions: I discussed the assessment and treatment plan with the patient. The patient was provided an opportunity to ask questions and all were answered. The patient agreed with the plan and demonstrated an understanding of the instructions.  A copy of instructions were sent to the patient via MyChart unless otherwise noted below.    The patient was advised to call back or seek an in-person evaluation if the symptoms worsen or if the condition fails to improve as anticipated.  Time:  I spent 9 minutes with the patient via telehealth technology discussing the above problems/concerns.    Mar Daring, PA-C

## 2022-03-25 NOTE — Patient Instructions (Signed)
Valerie Hayes, thank you for joining Valerie Loveless, PA-C for today's virtual visit.  While this provider is not your primary care provider (PCP), if your PCP is located in our provider database this encounter information will be shared with them immediately following your visit.   A [facility] MyChart account gives you access to today's visit and all your visits, tests, and labs performed at [facility] " click here if you don't have a [facility] MyChart account or go to [email or web address]  Consent: (Patient) Valerie Hayes provided verbal consent for this virtual visit at the beginning of the encounter.  Current Medications:  Current Outpatient Medications:    amoxicillin (AMOXIL) 500 MG tablet, Take 1 tablet (500 mg total) by mouth 2 (two) times daily., Disp: 20 tablet, Rfl: 0   neomycin-polymyxin-hydrocortisone (CORTISPORIN) OTIC solution, Place 3 drops into the right ear 4 (four) times daily. X 7 days, Disp: 10 mL, Rfl: 0   butalbital-acetaminophen-caffeine (FIORICET) 50-325-40 MG tablet, Take 1 tablet by mouth every 6 (six) hours as needed for headache., Disp: 14 tablet, Rfl: 0   EPINEPHrine 0.3 mg/0.3 mL IJ SOAJ injection, Inject 0.3 mg into the muscle as needed for anaphylaxis., Disp: 1 each, Rfl: 0   labetalol (NORMODYNE) 200 MG tablet, Take 1 tablet (200 mg total) by mouth 2 (two) times daily., Disp: 180 tablet, Rfl: 0   methocarbamol (ROBAXIN) 500 MG tablet, Take one po qhs prn muscle spasm, Disp: 20 tablet, Rfl: 0   sertraline (ZOLOFT) 100 MG tablet, Take 1 tablet (100 mg total) by mouth daily., Disp: 30 tablet, Rfl: 3   Medications ordered in this encounter:  Meds ordered this encounter  Medications   amoxicillin (AMOXIL) 500 MG tablet    Sig: Take 1 tablet (500 mg total) by mouth 2 (two) times daily.    Dispense:  20 tablet    Refill:  0    Order Specific Question:   Supervising Provider    Answer:   Merrilee Jansky [id number]    neomycin-polymyxin-hydrocortisone (CORTISPORIN) OTIC solution    Sig: Place 3 drops into the right ear 4 (four) times daily. X 7 days    Dispense:  10 mL    Refill:  0    Order Specific Question:   Supervising Provider    Answer:   Merrilee Jansky [[id number]]     *If you need refills on other medications prior to your next appointment, please contact your pharmacy*  Follow-Up: Call back or seek an in-person evaluation if the symptoms worsen or if the condition fails to improve as anticipated.   Virtual Care [phone]  Other Instructions  Otitis Media, Adult  Otitis media occurs when there is inflammation and fluid in the middle ear with signs and symptoms of an acute infection. The middle ear is a part of the ear that contains bones for hearing as well as air that helps send sounds to the brain. When infected fluid builds up in this space, it causes pressure and can lead to an ear infection. The eustachian tube connects the middle ear to the back of the nose (nasopharynx) and normally allows air into the middle ear. If the eustachian tube becomes blocked, fluid can build up and become infected. What are the causes? This condition is caused by a blockage in the eustachian tube. This can be caused by mucus or by swelling of the tube. Problems that can cause a blockage include: A cold or other upper respiratory  infection. Allergies. An irritant, such as tobacco smoke. Enlarged adenoids. The adenoids are areas of soft tissue located high in the back of the throat, behind the nose and the roof of the mouth. They are part of the body's defense system (immune system). A mass in the nasopharynx. Damage to the ear caused by pressure changes (barotrauma). What increases the risk? You are more likely to develop this condition if you: Smoke or are exposed to tobacco smoke. Have an opening in the roof of your mouth (cleft palate). Have gastroesophageal reflux. Have an immune  system disorder. What are the signs or symptoms? Symptoms of this condition include: Ear pain. Fever. Decreased hearing. Tiredness (lethargy). Fluid leaking from the ear, if the eardrum is ruptured or has burst. Ringing in the ear. How is this diagnosed?  This condition is diagnosed with a physical exam. During the exam, your health care provider will use an instrument called an otoscope to look in your ear and check for redness, swelling, and fluid. He or she will also ask about your symptoms. Your health care provider may also order tests, such as: A pneumatic otoscopy. This is a test to check the movement of the eardrum. It is done by squeezing a small amount of air into the ear. A tympanogram. This is a test that shows how well the eardrum moves in response to air pressure in the ear canal. It provides a graph for your health care provider to review. How is this treated? This condition can go away on its own within 3-5 days. But if the condition is caused by a bacterial infection and does not go away on its own, or if it keeps coming back, your health care provider may: Prescribe antibiotic medicine to treat the infection. Prescribe or recommend medicines to control pain. Follow these instructions at home: Take over-the-counter and prescription medicines only as told by your health care provider. If you were prescribed an antibiotic medicine, take it as told by your health care provider. Do not stop taking the antibiotic even if you start to feel better. Keep all follow-up visits. This is important. Contact a health care provider if: You have bleeding from your nose. There is a lump on your neck. You are not feeling better in 5 days. You feel worse instead of better. Get help right away if: You have severe pain that is not controlled with medicine. You have swelling, redness, or pain around your ear. You have stiffness in your neck. A part of your face is not moving  (paralyzed). The bone behind your ear (mastoid bone) is tender when you touch it. You develop a severe headache. Summary Otitis media is redness, soreness, and swelling of the middle ear, usually resulting in pain and decreased hearing. This condition can go away on its own within 3-5 days. If the problem does not go away in 3-5 days, your health care provider may give you medicines to treat the infection. If you were prescribed an antibiotic medicine, take it as told by your health care provider. Follow all instructions that were given to you by your health care provider. This information is not intended to replace advice given to you by your health care provider. Make sure you discuss any questions you have with your health care provider. Document Revised: 08/14/2020 Document Reviewed: 08/14/2020 Elsevier Patient Education  [place].    If you have been instructed to have an in-person evaluation today at a local Urgent Care facility, please use  the link below. It will take you to a list of all of our available [facility], including address, phone number and hours of operation. Please do not delay care.  [facility]  If you or a family member do not have a primary care provider, use the link below to schedule a visit and establish care. When you choose a [facility] primary care physician or advanced practice provider, you gain a long-term partner in health. Find a Primary Care Provider  Learn more about [facility]'s in-office and virtual care options: [facility]

## 2022-03-26 ENCOUNTER — Ambulatory Visit: Payer: Medicaid Other | Admitting: Family

## 2022-04-12 ENCOUNTER — Telehealth: Payer: Medicaid Other | Admitting: Physician Assistant

## 2022-04-12 DIAGNOSIS — R21 Rash and other nonspecific skin eruption: Secondary | ICD-10-CM | POA: Diagnosis not present

## 2022-04-12 MED ORDER — NYSTATIN-TRIAMCINOLONE 100000-0.1 UNIT/GM-% EX OINT: 1.0000 | TOPICAL_OINTMENT | Freq: Two times a day (BID) | CUTANEOUS | 0 refills | Status: DC

## 2022-04-12 MED ORDER — MUPIROCIN 2 % EX OINT: 1.0000 | TOPICAL_OINTMENT | Freq: Two times a day (BID) | CUTANEOUS | 0 refills | Status: DC

## 2022-04-12 NOTE — Progress Notes (Signed)
Virtual Visit Consent   Valerie Hayes, you are scheduled for a virtual visit with a [facility] provider today. Just as with appointments in the office, your consent must be obtained to participate. Your consent will be active for this visit and any virtual visit you may have with one of our providers in the next 365 days. If you have a MyChart account, a copy of this consent can be sent to you electronically.  As this is a virtual visit, video technology does not allow for your provider to perform a traditional examination. This may limit your provider's ability to fully assess your condition. If your provider identifies any concerns that need to be evaluated in person or the need to arrange testing (such as labs, EKG, etc.), we will make arrangements to do so. Although advances in technology are sophisticated, we cannot ensure that it will always work on either your end or our end. If the connection with a video visit is poor, the visit may have to be switched to a telephone visit. With either a video or telephone visit, we are not always able to ensure that we have a secure connection.  By engaging in this virtual visit, you consent to the provision of healthcare and authorize for your insurance to be billed (if applicable) for the services provided during this visit. Depending on your insurance coverage, you may receive a charge related to this service.  I need to obtain your verbal consent now. Are you willing to proceed with your visit today? Valerie Hayes has provided verbal consent on 04/12/2022 for a virtual visit (video or telephone). Valerie Loveless, PA-C  Date: 04/12/2022 6:52 PM  Virtual Visit via Video Note   I, Valerie Hayes, connected with  Valerie Hayes  ([id number], November 08, 2000) on 04/12/22 at  6:45 PM EST by a video-enabled telemedicine application and verified that I am speaking with the correct person using two identifiers.  Location: Patient: Virtual Visit  Location Patient: Home Provider: Virtual Visit Location Provider: Home Office   I discussed the limitations of evaluation and management by telemedicine and the availability of in person appointments. The patient expressed understanding and agreed to proceed.    History of Present Illness: Valerie Hayes is a 21 y.o. who identifies as a female who was assigned female at birth, and is being seen today for rash.  HPI: Rash This is a new problem. The current episode started in the past 7 days (Tuesday at work). The problem has been gradually worsening since onset. The affected locations include the right axilla. The rash is characterized by redness, peeling, swelling, burning and blistering. It is unknown if there was an exposure to a precipitant. Pertinent negatives include no congestion, cough, fatigue or shortness of breath. Past treatments include nothing. The treatment provided no relief.   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 Also, does have skin allergies but denies any new contacts.  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   Class 2 severe obesity with serious comorbidity in adult (HCC) 10/29/2021   Weight gain 10/29/2021   Insulin resistance 10/29/2021   Essential (primary) hypertension 09/07/2021   Chronic tension-type headache, intractable 09/07/2021   Seasonal allergic rhinitis 09/07/2021   Anxiety, generalized 09/07/2021   Migraine with aura and without status migrainosus,  not intractable 07/30/2021   PCOS (polycystic ovarian syndrome) 07/30/2021   Insomnia secondary to depression with anxiety 07/30/2021    Allergies:  Allergies  Allergen Reactions   Yellow Dyes (Non-Tartrazine) Anaphylaxis   Metformin And Related Nausea And Vomiting   Fexofenadine-Pseudoephed Er Nausea And Vomiting    All  allergy medications cause nausea and vomiting   Medications:  Current Outpatient Medications:    mupirocin ointment (BACTROBAN) 2 %, Apply 1 Application topically 2 (two) times daily., Disp: 22 g, Rfl: 0   nystatin-triamcinolone ointment (MYCOLOG), Apply 1 Application topically 2 (two) times daily., Disp: 60 g, Rfl: 0   amoxicillin (AMOXIL) 500 MG tablet, Take 1 tablet (500 mg total) by mouth 2 (two) times daily., Disp: 20 tablet, Rfl: 0   butalbital-acetaminophen-caffeine (FIORICET) 50-325-40 MG tablet, Take 1 tablet by mouth every 6 (six) hours as needed for headache., Disp: 14 tablet, Rfl: 0   EPINEPHrine 0.3 mg/0.3 mL IJ SOAJ injection, Inject 0.3 mg into the muscle as needed for anaphylaxis., Disp: 1 each, Rfl: 0   labetalol (NORMODYNE) 200 MG tablet, Take 1 tablet (200 mg total) by mouth 2 (two) times daily., Disp: 180 tablet, Rfl: 0   methocarbamol (ROBAXIN) 500 MG tablet, Take one po qhs prn muscle spasm, Disp: 20 tablet, Rfl: 0   neomycin-polymyxin-hydrocortisone (CORTISPORIN) OTIC solution, Place 3 drops into the right ear 4 (four) times daily. X 7 days, Disp: 10 mL, Rfl: 0   sertraline (ZOLOFT) 100 MG tablet, Take 1 tablet (100 mg total) by mouth daily., Disp: 30 tablet, Rfl: 3  Observations/Objective: Patient is well-developed, well-nourished in no acute distress.  Resting comfortably at home.  Head is normocephalic, atraumatic.  No labored breathing.  Speech is clear and coherent with logical content.  Patient is alert and oriented at baseline.  Well- demarcated, erythematous, linear lesion with irregular borders and peeling about half way through  Assessment and Plan: 1. Rash and nonspecific skin eruption - nystatin-triamcinolone ointment (MYCOLOG); Apply 1 Application topically 2 (two) times daily.  Dispense: 60 g; Refill: 0 - mupirocin ointment (BACTROBAN) 2 %; Apply 1 Application topically 2 (two) times daily.  Dispense: 22 g; Refill: 0  - DDx: fungal, MRSA, eczematous,  contact dermatitis - Mycolog and Mupirocin prescribed - Keep skin clean and dry - If worsening seek in person evaluation  Follow Up Instructions: I discussed the assessment and treatment plan with the patient. The patient was provided an opportunity to ask questions and all were answered. The patient agreed with the plan and demonstrated an understanding of the instructions.  A copy of instructions were sent to the patient via MyChart unless otherwise noted below.    The patient was advised to call back or seek an in-person evaluation if the symptoms worsen or if the condition fails to improve as anticipated.  Time:  I spent 10 minutes with the patient via telehealth technology discussing the above problems/concerns.    Valerie Loveless, PA-C

## 2022-04-12 NOTE — Patient Instructions (Signed)
Valerie Hayes, thank you for joining Valerie Loveless, PA-C for today's virtual visit.  While this provider is not your primary care provider (PCP), if your PCP is located in our provider database this encounter information will be shared with them immediately following your visit.   A [facility] MyChart account gives you access to today's visit and all your visits, tests, and labs performed at [facility] " click here if you don't have a [facility] MyChart account or go to [email or web address]  Consent: (Patient) Valerie Hayes provided verbal consent for this virtual visit at the beginning of the encounter.  Current Medications:  Current Outpatient Medications:    mupirocin ointment (BACTROBAN) 2 %, Apply 1 Application topically 2 (two) times daily., Disp: 22 g, Rfl: 0   nystatin-triamcinolone ointment (MYCOLOG), Apply 1 Application topically 2 (two) times daily., Disp: 60 g, Rfl: 0   amoxicillin (AMOXIL) 500 MG tablet, Take 1 tablet (500 mg total) by mouth 2 (two) times daily., Disp: 20 tablet, Rfl: 0   butalbital-acetaminophen-caffeine (FIORICET) 50-325-40 MG tablet, Take 1 tablet by mouth every 6 (six) hours as needed for headache., Disp: 14 tablet, Rfl: 0   EPINEPHrine 0.3 mg/0.3 mL IJ SOAJ injection, Inject 0.3 mg into the muscle as needed for anaphylaxis., Disp: 1 each, Rfl: 0   labetalol (NORMODYNE) 200 MG tablet, Take 1 tablet (200 mg total) by mouth 2 (two) times daily., Disp: 180 tablet, Rfl: 0   methocarbamol (ROBAXIN) 500 MG tablet, Take one po qhs prn muscle spasm, Disp: 20 tablet, Rfl: 0   neomycin-polymyxin-hydrocortisone (CORTISPORIN) OTIC solution, Place 3 drops into the right ear 4 (four) times daily. X 7 days, Disp: 10 mL, Rfl: 0   sertraline (ZOLOFT) 100 MG tablet, Take 1 tablet (100 mg total) by mouth daily., Disp: 30 tablet, Rfl: 3   Medications ordered in this encounter:  Meds ordered this encounter  Medications   nystatin-triamcinolone  ointment (MYCOLOG)    Sig: Apply 1 Application topically 2 (two) times daily.    Dispense:  60 g    Refill:  0    Order Specific Question:   Supervising Provider    Answer:   Merrilee Jansky [[id number]]   mupirocin ointment (BACTROBAN) 2 %    Sig: Apply 1 Application topically 2 (two) times daily.    Dispense:  22 g    Refill:  0    Order Specific Question:   Supervising Provider    Answer:   Merrilee Jansky [id number]     *If you need refills on other medications prior to your next appointment, please contact your pharmacy*  Follow-Up: Call back or seek an in-person evaluation if the symptoms worsen or if the condition fails to improve as anticipated.  [facility] [phone]  Other Instructions  Intertrigo Valerie Hayes is skin irritation or inflammation (dermatitis) that occurs when folds of skin rub together. The irritation can cause a rash and make skin raw and itchy. This condition mostly occurs in the skin folds of these areas: Armpits. Under the breasts. Under the abdomen. Groin. Buttocks. Toes. Intertrigo is not passed from person to person (is not contagious). What are the causes? This condition is caused by heat, moisture, rubbing (friction), and not enough air circulation. The condition can be made worse by: Sweat. Bacteria. A fungus, such as yeast. What increases the risk? This condition is more likely to occur if you have moisture in your skin folds. You are more likely to  develop this condition if you: Are not able to move around or are not active. Live in a warm and moist climate. Are not able to control your bowels or bladder (have incontinence). Wear splints, braces, or other medical devices. Are overweight. Have diabetes. What are the signs or symptoms? Symptoms of this condition include: A pink or red skin rash in a skin fold or near a skin fold. Raw or scaly skin. Itchiness or burning. Bleeding. Leaking fluid. A bad smell. How is  this diagnosed? This condition is diagnosed with a medical history and physical exam. You may also have a skin swab to test for bacteria or fungus. How is this treated? This condition may be treated by: Cleaning and drying your skin. Taking an antibiotic medicine or using an antibiotic skin cream for a bacterial infection. Using an antifungal cream on your skin or taking pills for an infection that was caused by a fungus, such as yeast. Using a steroid ointment to relieve itchiness and irritation. Separating the skin fold with a clean cotton cloth to absorb moisture and allow air to flow into the area. Follow these instructions at home: Keep the affected area clean and dry. Do not scratch your skin. Stay in a cool environment as much as possible. Use an air conditioner or fan, if available. Apply over-the-counter and prescription medicines only as told by your health care provider. If you were prescribed antibiotics, use them as told by your health care provider. Do not stop using the antibiotic even if you start to feel better. Keep all follow-up visits. Your health care provider may need to check how well your skin is responding to the treatment. How is this prevented? Shower and dry yourself well after activity or exercise. Use a hair dryer on a cool setting to dry between skin folds, especially after you bathe. Do not wear tight clothes. Wear clothes that are loose, absorbent, and made of cotton. Wear a bra that gives good support, if needed. Protect the skin around your groin and buttocks, especially if you have incontinence. Skin protection includes: Following a regular cleaning routine. Using skin protectant creams, powders, or ointments. Changing protection pads frequently. Maintain a healthy weight. Take care of your feet, especially if you have diabetes. Foot care includes: Wearing shoes that fit well. Keeping your feet dry. Wearing clean, breathable socks. If you have  diabetes, keep your blood sugar under control. Contact a health care provider if: Your symptoms do not improve with treatment. Your symptoms get worse or they spread. You notice increased redness and warmth. You have a fever. This information is not intended to replace advice given to you by your health care provider. Make sure you discuss any questions you have with your health care provider. Document Revised: 09/27/2021 Document Reviewed: 09/27/2021 Elsevier Patient Education  2023 Elsevier Inc.   Contact Dermatitis Dermatitis is redness, soreness, and swelling (inflammation) of the skin. Contact dermatitis is a reaction to certain substances that touch the skin. There are two types of this condition: Irritant contact dermatitis. This is the most common type. It happens when something irritates your skin, such as when your hands get dry from washing them too often with soap. You can get this type of reaction even if you have not been exposed to the irritant before. Allergic contact dermatitis. This type is caused by a substance that you are allergic to, such as poison ivy. It occurs when you have been exposed to the substance (allergen) and form a  sensitivity to it. In some cases, the reaction may start soon after your first exposure to the allergen. In other cases, it may not start until you are exposed to the allergen again. It may then occur every time you are exposed to the allergen in the future. What are the causes? Irritant contact dermatitis is often caused by exposure to: Makeup. Soaps, detergents, and bleaches. Acids. Metal salts, such as nickel. Allergic contact dermatitis is often caused by exposure to: Poisonous plants. Chemicals. Jewelry. Latex. Medicines. Preservatives in products, such as clothes. What increases the risk? You are more likely to get this condition if you have: A job that exposes you to irritants or allergens. Certain medical conditions. These include  asthma and eczema. What are the signs or symptoms? Symptoms of this condition may occur in any place on your body that has been touched by the irritant. Symptoms include: Dryness, flaking, or cracking. Redness. Itching. Pain or a burning feeling. Blisters. Drainage of small amounts of blood or clear fluid from skin cracks. With allergic contact dermatitis, there may also be swelling in areas such as the eyelids, mouth, or genitals. How is this diagnosed? This condition is diagnosed with a medical history and physical exam. A patch skin test may be done to help figure out the cause. If the condition is related to your job, you may need to see an expert in health problems in the workplace (occupational medicine specialist). How is this treated? This condition is treated by staying away from the cause of the reaction and protecting your skin from further contact. Treatment may also include: Steroid creams or ointments. Steroid medicines may need be taken by mouth (orally) in more severe cases. Antibiotics or medicines applied to the skin to kill bacteria (antibacterial ointments). These may be needed if a skin infection is present. Antihistamines. These may be taken orally or put on as a lotion to ease itching. A bandage (dressing). Follow these instructions at home: Skin care Moisturize your skin as needed. Put cool, wet cloths (cool compresses) on the affected areas. Try applying baking soda paste to your skin. Stir water into baking soda until it has the consistency of a paste. Do not scratch your skin. Avoid friction to the affected area. Avoid the use of soaps, perfumes, and dyes. Check the affected areas every day for signs of infection. Check for: More redness, swelling, or pain. More fluid or blood. Warmth. Pus or a bad smell. Medicines Take or apply over-the-counter and prescription medicines only as told by your health care provider. If you were prescribed antibiotics, take  or apply them as told by your health care provider. Do not stop using the antibiotic even if you start to feel better. Bathing Try taking a bath with: Epsom salts. Follow the instructions on the packaging. You can get these at your local pharmacy or grocery store. Baking soda. Pour a small amount into the bath as told by your health care provider. Colloidal oatmeal. Follow the instructions on the packaging. You can get this at your local pharmacy or grocery store. Bathe less often. This may mean bathing every other day. Bathe in lukewarm water. Avoid using hot water. Bandage care If you were given a dressing, change it as told by your health care provider. Wash your hands with soap and water for at least 20 seconds before and after you change your dressing. If soap and water are not available, use hand sanitizer. General instructions Avoid the substance that caused your reaction.  If you do not know what caused it, keep a journal to try to track what caused it. Write down: What you eat and drink. What cosmetics you use. What you wear in the affected area. This includes jewelry. Contact a health care provider if: Your condition does not get better with treatment. Your condition gets worse. You have any signs of infection. You have a fever. You have new symptoms. Your bone or joint under the affected area becomes painful after the skin has healed. Get help right away if: You notice red streaks coming from the affected area. The affected area turns darker. You have trouble breathing. This information is not intended to replace advice given to you by your health care provider. Make sure you discuss any questions you have with your health care provider. Document Revised: 11/09/2021 Document Reviewed: 11/09/2021 Elsevier Patient Education  2023 Elsevier Inc.    If you have been instructed to have an in-person evaluation today at a local Urgent Care facility, please use the link below. It  will take you to a list of all of our available [facility], including address, phone number and hours of operation. Please do not delay care.  [facility]  If you or a family member do not have a primary care provider, use the link below to schedule a visit and establish care. When you choose a [facility] primary care physician or advanced practice provider, you gain a long-term partner in health. Find a Primary Care Provider  Learn more about [facility] in-office and virtual care options: [facility] Get Care Now

## 2022-05-08 ENCOUNTER — Other Ambulatory Visit: Payer: Self-pay | Admitting: Physician Assistant

## 2022-05-08 ENCOUNTER — Telehealth: Payer: Medicaid Other | Admitting: Physician Assistant

## 2022-05-08 DIAGNOSIS — G43809 Other migraine, not intractable, without status migrainosus: Secondary | ICD-10-CM | POA: Diagnosis not present

## 2022-05-08 MED ORDER — ONDANSETRON 4 MG PO TBDP: 4.0000 mg | ORAL_TABLET | Freq: Three times a day (TID) | ORAL | 0 refills | Status: DC | PRN

## 2022-05-08 MED ORDER — METHYLPREDNISOLONE 4 MG PO TBPK: ORAL_TABLET | ORAL | 0 refills | Status: DC

## 2022-05-08 NOTE — Progress Notes (Signed)
Virtual Visit Consent   Valerie Hayes, you are scheduled for a virtual visit with a [facility] provider today. Just as with appointments in the office, your consent must be obtained to participate. Your consent will be active for this visit and any virtual visit you may have with one of our providers in the next 365 days. If you have a MyChart account, a copy of this consent can be sent to you electronically.  As this is a virtual visit, video technology does not allow for your provider to perform a traditional examination. This may limit your provider's ability to fully assess your condition. If your provider identifies any concerns that need to be evaluated in person or the need to arrange testing (such as labs, EKG, etc.), we will make arrangements to do so. Although advances in technology are sophisticated, we cannot ensure that it will always work on either your end or our end. If the connection with a video visit is poor, the visit may have to be switched to a telephone visit. With either a video or telephone visit, we are not always able to ensure that we have a secure connection.  By engaging in this virtual visit, you consent to the provision of healthcare and authorize for your insurance to be billed (if applicable) for the services provided during this visit. Depending on your insurance coverage, you may receive a charge related to this service.  I need to obtain your verbal consent now. Are you willing to proceed with your visit today? Valerie Hayes has provided verbal consent on 05/08/2022 for a virtual visit (video or telephone). Valerie Hayes, [place]  Date: 05/08/2022 2:07 PM  Virtual Visit via Video Note   I, Valerie Hayes, connected with  ALEJANDRO MCCALLUM  ([id number], [birth year]) on 05/08/22 at  1:45 PM EST by a video-enabled telemedicine application and verified that I am speaking with the correct person using two identifiers.  Location: Patient: Virtual Visit  Location Patient: Home Provider: Virtual Visit Location Provider: Home Office   I discussed the limitations of evaluation and management by telemedicine and the availability of in person appointments. The patient expressed understanding and agreed to proceed.    History of Present Illness: Valerie Hayes is a 21 y.o. who identifies as a female who was assigned female at birth, and is being seen today for persistent headache over the past 2 days.  Patient has a history of both migraines and tension headaches.  Notes this seems more like a migraine headache.  Is frontal and associated with photophobia and some nausea.  Denies aura or vision change.  Denies associated URI symptoms.  Took her butalbital that she is prescribed which typically fully resolves the headache.  Notes it helped significantly but did not resolve the headache.  Headache is again present and building today.  Is trying to avoid an in person evaluation due to her work schedule if possible.   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   Class 2 severe obesity with serious comorbidity in adult ([place]) 10/29/2021   Weight gain 10/29/2021   Insulin resistance 10/29/2021   Essential (primary) hypertension 09/07/2021   Chronic tension-type headache, intractable 09/07/2021   Seasonal allergic rhinitis 09/07/2021   Anxiety, generalized 09/07/2021   Migraine with aura and without status migrainosus, not intractable 07/30/2021   PCOS (  polycystic ovarian syndrome) 07/30/2021   Insomnia secondary to depression with anxiety 07/30/2021    Allergies:  Allergies  Allergen Reactions   Yellow Dyes (Non-Tartrazine) Anaphylaxis   Metformin And Related Nausea And Vomiting   Fexofenadine-Pseudoephed Er Nausea And Vomiting    All allergy medications cause nausea and vomiting   Medications:  Current  Outpatient Medications:    methylPREDNISolone (MEDROL DOSEPAK) 4 MG TBPK tablet, Take following package directions., Disp: 21 tablet, Rfl: 0   butalbital-acetaminophen-caffeine (FIORICET) 50-325-40 MG tablet, Take 1 tablet by mouth every 6 (six) hours as needed for headache., Disp: 14 tablet, Rfl: 0   EPINEPHrine 0.3 mg/0.3 mL IJ SOAJ injection, Inject 0.3 mg into the muscle as needed for anaphylaxis., Disp: 1 each, Rfl: 0   labetalol (NORMODYNE) 200 MG tablet, Take 1 tablet (200 mg total) by mouth 2 (two) times daily., Disp: 180 tablet, Rfl: 0   methocarbamol (ROBAXIN) 500 MG tablet, Take one po qhs prn muscle spasm, Disp: 20 tablet, Rfl: 0   sertraline (ZOLOFT) 100 MG tablet, Take 1 tablet (100 mg total) by mouth daily., Disp: 30 tablet, Rfl: 3  Observations/Objective: Patient is well-developed, well-nourished in no acute distress.  Resting comfortably at home.  Head is normocephalic, atraumatic.  No labored breathing. Speech is clear and coherent with logical content.  Patient is alert and oriented at baseline.   Assessment and Plan: 1. Other migraine without status migrainosus, not intractable - methylPREDNISolone (MEDROL DOSEPAK) 4 MG TBPK tablet; Take following package directions.  Dispense: 21 tablet; Refill: 0  In absence of alarm signs or symptoms and significant history of migraine headache, feel okay to treat via video visit today.  Supportive measures and OTC medications reviewed with patient.  Will have her stop the butalbital since she is taken a couple of doses over the past 2 days.  Will get a steroid pack to try to abort current headache and break this headache cycle.  Needs follow-up with primary care if not resolving as expected.  Urgent care/ER precautions reviewed with patient.  Work note provided.  Follow Up Instructions: I discussed the assessment and treatment plan with the patient. The patient was provided an opportunity to ask questions and all were answered. The  patient agreed with the plan and demonstrated an understanding of the instructions.  A copy of instructions were sent to the patient via MyChart unless otherwise noted below.   The patient was advised to call back or seek an in-person evaluation if the symptoms worsen or if the condition fails to improve as anticipated.  Time:  I spent 12 minutes with the patient via telehealth technology discussing the above problems/concerns.    [name], PA-C

## 2022-05-08 NOTE — Patient Instructions (Signed)
  [name], thank you for joining Valerie Climes, PA-C for today's virtual visit.  While this provider is not your primary care provider (PCP), if your PCP is located in our provider database this encounter information will be shared with them immediately following your visit.   A [facility] MyChart account gives you access to today's visit and all your visits, tests, and labs performed at [facility] " click here if you don't have a [facility] MyChart account or go to [email or web address]  Consent: (Patient) Valerie Hayes provided verbal consent for this virtual visit at the beginning of the encounter.  Current Medications:  Current Outpatient Medications:    amoxicillin (AMOXIL) 500 MG tablet, Take 1 tablet (500 mg total) by mouth 2 (two) times daily., Disp: 20 tablet, Rfl: 0   butalbital-acetaminophen-caffeine (FIORICET) 50-325-40 MG tablet, Take 1 tablet by mouth every 6 (six) hours as needed for headache., Disp: 14 tablet, Rfl: 0   EPINEPHrine 0.3 mg/0.3 mL IJ SOAJ injection, Inject 0.3 mg into the muscle as needed for anaphylaxis., Disp: 1 each, Rfl: 0   labetalol (NORMODYNE) 200 MG tablet, Take 1 tablet (200 mg total) by mouth 2 (two) times daily., Disp: 180 tablet, Rfl: 0   methocarbamol (ROBAXIN) 500 MG tablet, Take one po qhs prn muscle spasm, Disp: 20 tablet, Rfl: 0   mupirocin ointment (BACTROBAN) 2 %, Apply 1 Application topically 2 (two) times daily., Disp: 22 g, Rfl: 0   neomycin-polymyxin-hydrocortisone (CORTISPORIN) OTIC solution, Place 3 drops into the right ear 4 (four) times daily. X 7 days, Disp: 10 mL, Rfl: 0   nystatin-triamcinolone ointment (MYCOLOG), Apply 1 Application topically 2 (two) times daily., Disp: 60 g, Rfl: 0   sertraline (ZOLOFT) 100 MG tablet, Take 1 tablet (100 mg total) by mouth daily., Disp: 30 tablet, Rfl: 3   Medications ordered in this encounter:  No orders of the defined types were placed in this encounter.     *If you need refills on other medications prior to your next appointment, please contact your pharmacy*  Follow-Up: Call back or seek an in-person evaluation if the symptoms worsen or if the condition fails to improve as anticipated.  [facility] [phone]  Other Instructions Please keep well-hydrated and try to get plenty of rest. Make sure you are not skipping meals. Use the Zofran as directed, if needed for nausea. Switch from ibuprofen over-the-counter to Tylenol. Take the steroid pack as directed. If symptoms or not resolving with treatment given, or if you note any new or worsening symptoms despite treatment, you need to seek an in person evaluation. If you note any vision change, severe headache or dizziness, you need an ER evaluation.  Do not delay care.   If you have been instructed to have an in-person evaluation today at a local Urgent Care facility, please use the link below. It will take you to a list of all of our available [facility], including address, phone number and hours of operation. Please do not delay care.  [facility]  If you or a family member do not have a primary care provider, use the link below to schedule a visit and establish care. When you choose a [facility] primary care physician or advanced practice provider, you gain a long-term partner in health. Find a Primary Care Provider  Learn more about [facility]'s in-office and virtual care options: [facility] Get Care Now

## 2022-05-15 ENCOUNTER — Telehealth: Payer: Self-pay

## 2022-05-15 NOTE — Telephone Encounter (Signed)
Transition Care Management Unsuccessful Follow-up Telephone Call  Date of discharge and from where:  05/14/2022 [facility]  Attempts:  1st Attempt  Reason for unsuccessful TCM follow-up call:  Left voice message

## 2022-10-24 ENCOUNTER — Telehealth: Payer: Self-pay

## 2022-10-24 NOTE — Transitions of Care (Post Inpatient/ED Visit) (Signed)
   10/29/2022  Name: Valerie Hayes MRN: [id number] DOB: 12/21/2000  Today's TOC FU Call Status: Today's TOC FU Call Status:: Unsuccessful Call (3rd Attempt) Unsuccessful Call (2nd Attempt) Date: 10/28/22 Unsuccessful Call (3rd Attempt) Date: 10/29/22  Attempted to reach the patient regarding the most recent Inpatient/ED visit.  Follow Up Plan: No further outreach attempts will be made at this time. We have been unable to contact the patient.  Signature  Fredirick Maudlin

## 2022-10-28 NOTE — Transitions of Care (Post Inpatient/ED Visit) (Signed)
2nd attempt made to contact pt; pts mailbox is full.10/23/22 pt LWBS at [facility] ED and per chart review tab pt was seen [facility]. See UC note workers comp case; sending to [employer] FNP.       10/28/2022  Name: Valerie Hayes MRN: [id number] DOB: [birth year]  Today's TOC FU Call Status: Today's TOC FU Call Status:: Unsuccessful Call (2nd Attempt) Unsuccessful Call (2nd Attempt) Date: 10/28/22  Attempted to reach the patient regarding the most recent Inpatient/ED visit.  Follow Up Plan: No further outreach attempts will be made at this time. We have been unable to contact the patient.  Signature  Lewanda Rife, LPN

## 2023-02-25 ENCOUNTER — Telehealth: Payer: 59 | Admitting: Physician Assistant

## 2023-02-25 DIAGNOSIS — J069 Acute upper respiratory infection, unspecified: Secondary | ICD-10-CM

## 2023-02-25 MED ORDER — ALBUTEROL SULFATE HFA 108 (90 BASE) MCG/ACT IN AERS: 2.0000 | INHALATION_SPRAY | Freq: Four times a day (QID) | RESPIRATORY_TRACT | 0 refills | Status: DC | PRN

## 2023-02-25 NOTE — Patient Instructions (Signed)
  [name], thank you for joining [name], PA-C for today's virtual visit.  While this provider is not your primary care provider (PCP), if your PCP is located in our provider database this encounter information will be shared with them immediately following your visit.   A [facility] MyChart account gives you access to today's visit and all your visits, tests, and labs performed at [facility] " click here if you don't have a [facility] MyChart account or go to [email or web address]  Consent: (Patient) Valerie Hayes provided verbal consent for this virtual visit at the beginning of the encounter.  Current Medications:  Current Outpatient Medications:    butalbital-acetaminophen-caffeine (FIORICET) 50-325-40 MG tablet, Take 1 tablet by mouth every 6 (six) hours as needed for headache., Disp: 14 tablet, Rfl: 0   EPINEPHrine 0.3 mg/0.3 mL IJ SOAJ injection, Inject 0.3 mg into the muscle as needed for anaphylaxis., Disp: 1 each, Rfl: 0   labetalol (NORMODYNE) 200 MG tablet, Take 1 tablet (200 mg total) by mouth 2 (two) times daily., Disp: 180 tablet, Rfl: 0   methocarbamol (ROBAXIN) 500 MG tablet, Take one po qhs prn muscle spasm, Disp: 20 tablet, Rfl: 0   methylPREDNISolone (MEDROL DOSEPAK) 4 MG TBPK tablet, Take following package directions., Disp: 21 tablet, Rfl: 0   ondansetron (ZOFRAN-ODT) 4 MG disintegrating tablet, Take 1 tablet (4 mg total) by mouth every 8 (eight) hours as needed for nausea or vomiting., Disp: 20 tablet, Rfl: 0   sertraline (ZOLOFT) 100 MG tablet, Take 1 tablet (100 mg total) by mouth daily., Disp: 30 tablet, Rfl: 3   Medications ordered in this encounter:  No orders of the defined types were placed in this encounter.    *If you need refills on other medications prior to your next appointment, please contact your pharmacy*  Follow-Up: Call back or seek an in-person evaluation if the symptoms worsen or if the condition fails to  improve as anticipated.  [facility] [phone]  Other Instructions Keep hydrated and rest. Ok to take Tylenol and Sudafed OTC. Ok to continue Claritin if you tolerate it well. Use the albuterol as directed. Monitor for any new/worsening symptoms over the next 48 hours and report to me.    If you have been instructed to have an in-person evaluation today at a local Urgent Care facility, please use the link below. It will take you to a list of all of our available [facility], including address, phone number and hours of operation. Please do not delay care.  [facility]  If you or a family member do not have a primary care provider, use the link below to schedule a visit and establish care. When you choose a [facility] primary care physician or advanced practice provider, you gain a long-term partner in health. Find a Primary Care Provider  Learn more about [facility] in-office and virtual care options: [facility] - Get Care Now

## 2023-02-25 NOTE — Progress Notes (Signed)
Virtual Visit Consent   Valerie Hayes, you are scheduled for a virtual visit with a [facility] provider today. Just as with appointments in the office, your consent must be obtained to participate. Your consent will be active for this visit and any virtual visit you may have with one of our providers in the next 365 days. If you have a MyChart account, a copy of this consent can be sent to you electronically.  As this is a virtual visit, video technology does not allow for your provider to perform a traditional examination. This may limit your provider's ability to fully assess your condition. If your provider identifies any concerns that need to be evaluated in person or the need to arrange testing (such as labs, EKG, etc.), we will make arrangements to do so. Although advances in technology are sophisticated, we cannot ensure that it will always work on either your end or our end. If the connection with a video visit is poor, the visit may have to be switched to a telephone visit. With either a video or telephone visit, we are not always able to ensure that we have a secure connection.  By engaging in this virtual visit, you consent to the provision of healthcare and authorize for your insurance to be billed (if applicable) for the services provided during this visit. Depending on your insurance coverage, you may receive a charge related to this service.  I need to obtain your verbal consent now. Are you willing to proceed with your visit today? Valerie Hayes has provided verbal consent on 02/25/2023 for a virtual visit (video or telephone). [name], [place]  Date: 02/25/2023 10:09 AM  Virtual Visit via Video Note   I, [name], connected with  Valerie Hayes  ([id number], [birth year]) on 02/25/23 at  9:45 AM EDT by a video-enabled telemedicine application and verified that I am speaking with the correct person using two identifiers.  Location: Patient: Virtual Visit  Location Patient: Home Provider: Virtual Visit Location Provider: Home Office   I discussed the limitations of evaluation and management by telemedicine and the availability of in person appointments. The patient expressed understanding and agreed to proceed.    History of Present Illness: Valerie Hayes is a 22 y.o. who identifies as a female who was assigned female at birth, and is being seen today for several days of chest congestion and nasal congestion with fatigue and some windedness with exertion. Some chest tightness. Denies fever, chills, aches. Mild loose stools. Too a home COVID test that was negative.  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   Class 2 severe obesity with serious comorbidity in adult (HCC) 10/29/2021   Weight gain 10/29/2021   Insulin resistance 10/29/2021   Essential (primary) hypertension 09/07/2021   Chronic tension-type headache, intractable 09/07/2021   Seasonal allergic rhinitis 09/07/2021   Anxiety, generalized 09/07/2021   Migraine with aura and without status migrainosus, not intractable 07/30/2021   PCOS (polycystic ovarian syndrome) 07/30/2021   Insomnia secondary to depression with anxiety 07/30/2021    Allergies:  Allergies  Allergen Reactions   Yellow Dyes (Non-Tartrazine) Anaphylaxis   Metformin And Related Nausea And Vomiting   Fexofenadine-Pseudoephed Er Nausea And Vomiting    All allergy medications cause nausea and vomiting   Medications:  Current Outpatient Medications:  albuterol (VENTOLIN HFA) 108 (90 Base) MCG/ACT inhaler, Inhale 2 puffs into the lungs every 6 (six) hours as needed for wheezing or shortness of breath., Disp: 8 g, Rfl: 0   baclofen (LIORESAL) 10 MG tablet, Take by mouth., Disp: , Rfl:    ketorolac (TORADOL) 10 MG tablet, Take by mouth., Disp: , Rfl:     EPINEPHrine 0.3 mg/0.3 mL IJ SOAJ injection, Inject 0.3 mg into the muscle as needed for anaphylaxis., Disp: 1 each, Rfl: 0  Observations/Objective: Patient is well-developed, well-nourished in no acute distress.  Resting comfortably at home.  Head is normocephalic, atraumatic.  No labored breathing. Speech is clear and coherent with logical content.  Patient is alert and oriented at baseline.   Assessment and Plan: 1. Viral URI - albuterol (VENTOLIN HFA) 108 (90 Base) MCG/ACT inhaler; Inhale 2 puffs into the lungs every 6 (six) hours as needed for wheezing or shortness of breath.  Dispense: 8 g; Refill: 0  Supportive measures and OTC medications reviewed. COVID negative. Albuterol per orders. Work note provided. She is to report any new symptoms developing over the next 48 hours via MyChart message.   Follow Up Instructions: I discussed the assessment and treatment plan with the patient. The patient was provided an opportunity to ask questions and all were answered. The patient agreed with the plan and demonstrated an understanding of the instructions.  A copy of instructions were sent to the patient via MyChart unless otherwise noted below.   The patient was advised to call back or seek an in-person evaluation if the symptoms worsen or if the condition fails to improve as anticipated.  Time:  I spent 10 minutes with the patient via telehealth technology discussing the above problems/concerns.    [name], PA-C

## 2023-04-04 ENCOUNTER — Telehealth: Payer: 59 | Admitting: Family Medicine

## 2023-04-04 DIAGNOSIS — A084 Viral intestinal infection, unspecified: Secondary | ICD-10-CM | POA: Diagnosis not present

## 2023-04-04 MED ORDER — PROMETHAZINE HCL 25 MG PO TABS: 25.0000 mg | ORAL_TABLET | Freq: Three times a day (TID) | ORAL | 0 refills | Status: AC | PRN

## 2023-04-04 NOTE — Progress Notes (Signed)
Virtual Visit Consent   Valerie Hayes, you are scheduled for a virtual visit with a [facility] provider today. Just as with appointments in the office, your consent must be obtained to participate. Your consent will be active for this visit and any virtual visit you may have with one of our providers in the next 365 days. If you have a MyChart account, a copy of this consent can be sent to you electronically.  As this is a virtual visit, video technology does not allow for your provider to perform a traditional examination. This may limit your provider's ability to fully assess your condition. If your provider identifies any concerns that need to be evaluated in person or the need to arrange testing (such as labs, EKG, etc.), we will make arrangements to do so. Although advances in technology are sophisticated, we cannot ensure that it will always work on either your end or our end. If the connection with a video visit is poor, the visit may have to be switched to a telephone visit. With either a video or telephone visit, we are not always able to ensure that we have a secure connection.  By engaging in this virtual visit, you consent to the provision of healthcare and authorize for your insurance to be billed (if applicable) for the services provided during this visit. Depending on your insurance coverage, you may receive a charge related to this service.  I need to obtain your verbal consent now. Are you willing to proceed with your visit today? Valerie Hayes has provided verbal consent on 04/04/2023 for a virtual visit (video or telephone). Georgana Curio, FNP  Date: 04/04/2023 2:45 PM  Virtual Visit via Video Note   I, Georgana Curio, connected with  Valerie Hayes  ([id number], 2000/10/31) on 04/04/23 at  2:45 PM EST by a video-enabled telemedicine application and verified that I am speaking with the correct person using two identifiers.  Location: Patient: Virtual Visit Location Patient:  Home Provider: Virtual Visit Location Provider: Home Office   I discussed the limitations of evaluation and management by telemedicine and the availability of in person appointments. The patient expressed understanding and agreed to proceed.    History of Present Illness: Valerie Hayes is a 22 y.o. who identifies as a female who was assigned female at birth, and is being seen today for persistent nausea starting Monday with vomiting and diarrhea resolved yesterday but nausea persists and zofran is not helping. No fever. She is urinating and feels hydrated. She tested neg for covid Monday when seen for URU sx. Not on antibiotics. 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   Class 2 severe obesity with serious comorbidity in adult (HCC) 10/29/2021   Weight gain 10/29/2021   Insulin resistance 10/29/2021   Essential (primary) hypertension 09/07/2021   Chronic tension-type headache, intractable 09/07/2021   Seasonal allergic rhinitis 09/07/2021   Anxiety, generalized 09/07/2021   Migraine with aura and without status migrainosus, not intractable 07/30/2021   PCOS (polycystic ovarian syndrome) 07/30/2021   Insomnia secondary to depression with anxiety 07/30/2021    Allergies:  Allergies  Allergen Reactions   Yellow Dyes (Non-Tartrazine) Anaphylaxis   Metformin And Related Nausea And Vomiting   Fexofenadine-Pseudoephed Er Nausea And Vomiting    All allergy medications cause nausea and vomiting   Medications:  Current Outpatient  Medications:    promethazine (PHENERGAN) 25 MG tablet, Take 1 tablet (25 mg total) by mouth every 8 (eight) hours as needed for nausea or vomiting., Disp: 20 tablet, Rfl: 0   albuterol (VENTOLIN HFA) 108 (90 Base) MCG/ACT inhaler, Inhale 2 puffs into the lungs every 6 (six) hours as needed for wheezing or shortness of  breath., Disp: 8 g, Rfl: 0   baclofen (LIORESAL) 10 MG tablet, Take by mouth., Disp: , Rfl:    EPINEPHrine 0.3 mg/0.3 mL IJ SOAJ injection, Inject 0.3 mg into the muscle as needed for anaphylaxis., Disp: 1 each, Rfl: 0   ketorolac (TORADOL) 10 MG tablet, Take by mouth., Disp: , Rfl:   Observations/Objective: Patient is well-developed, well-nourished in no acute distress.  Resting comfortably  at home.  Head is normocephalic, atraumatic.  No labored breathing.  Speech is clear and coherent with logical content.  Patient is alert and oriented at baseline.   Assessment and Plan: 1. Viral gastroenteritis  Increase fluids, no milk or dairy, progress diet lightly as tolerated, UC if sx persist or worsen.   Follow Up Instructions: I discussed the assessment and treatment plan with the patient. The patient was provided an opportunity to ask questions and all were answered. The patient agreed with the plan and demonstrated an understanding of the instructions.  A copy of instructions were sent to the patient via MyChart unless otherwise noted below.     The patient was advised to call back or seek an in-person evaluation if the symptoms worsen or if the condition fails to improve as anticipated.    Georgana Curio, FNP

## 2023-04-04 NOTE — Patient Instructions (Signed)

## 2023-07-16 ENCOUNTER — Other Ambulatory Visit: Payer: Self-pay

## 2023-07-16 ENCOUNTER — Emergency Department
Admission: EM | Admit: 2023-07-16 | Discharge: 2023-07-16 | Disposition: A | Discharge status: Home / Self Care | Payer: Medicaid Other | Attending: Emergency Medicine | Admitting: Emergency Medicine

## 2023-07-16 DIAGNOSIS — I10 Essential (primary) hypertension: Secondary | ICD-10-CM | POA: Diagnosis not present

## 2023-07-16 DIAGNOSIS — L509 Urticaria, unspecified: Secondary | ICD-10-CM | POA: Diagnosis not present

## 2023-07-16 DIAGNOSIS — R21 Rash and other nonspecific skin eruption: Secondary | ICD-10-CM | POA: Diagnosis present

## 2023-07-16 MED ORDER — DIPHENHYDRAMINE HCL 50 MG/ML IJ SOLN
25.0000 mg | Freq: Once | INTRAMUSCULAR | Status: AC
  Administered 2023-07-16: 25 mg via INTRAVENOUS
  Filled 2023-07-16: qty 1

## 2023-07-16 MED ORDER — METHYLPREDNISOLONE SODIUM SUCC 125 MG IJ SOLR
125.0000 mg | Freq: Once | INTRAMUSCULAR | Status: AC
  Administered 2023-07-16: 125 mg via INTRAVENOUS
  Filled 2023-07-16: qty 2

## 2023-07-16 MED ORDER — SODIUM CHLORIDE 0.9 % IV BOLUS
500.0000 mL | Freq: Once | INTRAVENOUS | Status: AC
  Administered 2023-07-16: 500 mL via INTRAVENOUS

## 2023-07-16 NOTE — ED Notes (Signed)
 The pt was sitting upright on the bed alert and oriented x 4. The pt was warm, pink, and dry. The pt advised she was seen last at [facility] for a rash and was given solumedrol and benadryl. The pt advised the rash went away but then this morning when she wok up the rash was back but much worse. The rash is red hives on her chest, arms, and buttocks per pt. The pt denies any resp distress at this time. The pt was provided a call bell on the sided of her bed if any assistance is needed.

## 2023-07-16 NOTE — ED Triage Notes (Signed)
 Pt sts that she has been having a rash on her body since last night. Pt sts that she was given solumedrol and benadryl last night and it went away however it is back again.

## 2023-07-16 NOTE — Discharge Instructions (Addendum)
 Please continue taking your cetirizine daily.  Take the prednisone as prescribed.  Take Benadryl every 6-8 hours if needed for return of hives or itching.

## 2023-07-16 NOTE — ED Notes (Signed)
 The pt advised her itching and rash has improved.

## 2023-07-16 NOTE — ED Provider Notes (Signed)
 [facility] Provider Note    Event Date/Time   First MD Initiated Contact with Patient 07/16/23 [phone]     (approximate)   History   Rash   HPI  Valerie Hayes is a 23 y.o. female  with history of hypertension, anxiety, and as listed in EMR presents to the emergency department for treatment of pruritic rash since yesterday. She was treated with solumedrol, benadryl and fluids last night which helped. Symptoms returned this morning.       Physical Exam   Triage Vital Signs: ED Triage Vitals  Encounter Vitals Group     BP 07/16/23 0741 (!) 147/102     Systolic BP Percentile --      Diastolic BP Percentile --      Pulse Rate 07/16/23 0741 97     Resp 07/16/23 0741 17     Temp 07/16/23 0741 97.8 F (36.6 C)     Temp Source 07/16/23 0741 Oral     SpO2 07/16/23 0741 100 %     Weight 07/16/23 0742 200 lb (90.7 kg)     Height 07/16/23 0742 5\' 3"  (1.6 m)     Head Circumference --      Peak Flow --      Pain Score 07/16/23 0742 0     Pain Loc --      Pain Education --      Exclude from Growth Chart --     Most recent vital signs: Vitals:   07/16/23 0741  BP: (!) 147/102  Pulse: 97  Resp: 17  Temp: 97.8 F (36.6 C)  SpO2: 100%    General: Awake, no distress.  CV:  Good peripheral perfusion.  Resp:  Normal effort.  Abd:  No distention.  Other:  Erythematous, maculopapular lesions scattered over extremities, trunk, and neck   ED Results / Procedures / Treatments   Labs (all labs ordered are listed, but only abnormal results are displayed) Labs Reviewed - No data to display   EKG  Not indicated   RADIOLOGY  Image and radiology report reviewed and interpreted by me. Radiology report consistent with the same.  Not indicated  PROCEDURES:  Critical Care performed: No  Procedures   MEDICATIONS ORDERED IN ED:  Medications  methylPREDNISolone sodium succinate (SOLU-MEDROL) 125 mg/2 mL injection 125 mg (125 mg Intravenous  Given 07/16/23 0852)  diphenhydrAMINE (BENADRYL) injection 25 mg (25 mg Intravenous Given 07/16/23 0853)  sodium chloride 0.9 % bolus 500 mL (0 mLs Intravenous Stopped 07/16/23 0952)     IMPRESSION / MDM / ASSESSMENT AND PLAN / ED COURSE   I have reviewed the triage note.  Differential diagnosis includes, but is not limited to, contact dermatitis, allergic dermatitis, anaphylaxis  Patient's presentation is most consistent with acute presentation with potential threat to life or bodily function.  23 year old female presenting to the emergency department for treatment evaluation of pruritic rash that started yesterday.  She was evaluated in the emergency department and given Solu-Medrol and fluids along with Benadryl with complete resolution last night.  She awakened this morning with same symptoms.  Due to the late hour that she was discharged from the emergency department she was unable to pick up her prescriptions for the prednisone and did not have Benadryl at home.  Exam is reassuring.  She is not tachypneic, SpO2 is 100% on room air.  Airway is patent and speech is clear.  Plan will be to repeat fluids, Solu-Medrol, and Benadryl.  Patient  significantly improved after meds.  She will be discharged with instructions to take the prednisone as prescribed last night.  She will also take Benadryl every 6-8 hours if needed for return of hives or itching.      FINAL CLINICAL IMPRESSION(S) / ED DIAGNOSES   Final diagnoses:  Urticaria     Rx / DC Orders   ED Discharge Orders     None        Note:  This document was prepared using Dragon voice recognition software and may include unintentional dictation errors.   Chinita Pester, FNP 07/16/23 1541    Janith Lima, MD 07/17/23 [phone]

## 2023-07-28 ENCOUNTER — Telehealth: Admitting: Physician Assistant

## 2023-07-28 ENCOUNTER — Telehealth: Payer: Self-pay

## 2023-07-28 DIAGNOSIS — U071 COVID-19: Secondary | ICD-10-CM | POA: Diagnosis not present

## 2023-07-28 MED ORDER — NIRMATRELVIR/RITONAVIR (PAXLOVID)TABLET: 3.0000 | ORAL_TABLET | Freq: Two times a day (BID) | ORAL | 0 refills | Status: AC

## 2023-07-28 MED ORDER — PROMETHAZINE-DM 6.25-15 MG/5ML PO SYRP
5.0000 mL | ORAL_SOLUTION | Freq: Four times a day (QID) | ORAL | 0 refills | Status: AC | PRN
Start: 2023-07-28 — End: ?

## 2023-07-28 MED ORDER — ALBUTEROL SULFATE HFA 108 (90 BASE) MCG/ACT IN AERS: 1.0000 | INHALATION_SPRAY | Freq: Four times a day (QID) | RESPIRATORY_TRACT | 0 refills | Status: AC | PRN

## 2023-07-28 NOTE — Patient Instructions (Signed)
 [name], thank you for joining [name], PA-C for today's virtual visit.  While this provider is not your primary care provider (PCP), if your PCP is located in our provider database this encounter information will be shared with them immediately following your visit.   A [facility] MyChart account gives you access to today's visit and all your visits, tests, and labs performed at [facility] " click here if you don't have a [facility] MyChart account or go to [email or web address]  Consent: (Patient) Valerie Hayes provided verbal consent for this virtual visit at the beginning of the encounter.  Current Medications:  Current Outpatient Medications:    albuterol (VENTOLIN HFA) 108 (90 Base) MCG/ACT inhaler, Inhale 1-2 puffs into the lungs every 6 (six) hours as needed., Disp: 8 g, Rfl: 0   nirmatrelvir/ritonavir (PAXLOVID) 20 x 150 MG & 10 x 100MG  TABS, Take 3 tablets by mouth 2 (two) times daily for 5 days. (Take nirmatrelvir 150 mg two tablets twice daily for 5 days and ritonavir 100 mg one tablet twice daily for 5 days) Patient GFR is 132, Disp: 30 tablet, Rfl: 0   promethazine-dextromethorphan (PROMETHAZINE-DM) 6.25-15 MG/5ML syrup, Take 5 mLs by mouth 4 (four) times daily as needed., Disp: 118 mL, Rfl: 0   baclofen (LIORESAL) 10 MG tablet, Take by mouth., Disp: , Rfl:    EPINEPHrine 0.3 mg/0.3 mL IJ SOAJ injection, Inject 0.3 mg into the muscle as needed for anaphylaxis., Disp: 1 each, Rfl: 0   ketorolac (TORADOL) 10 MG tablet, Take by mouth., Disp: , Rfl:    promethazine (PHENERGAN) 25 MG tablet, Take 1 tablet (25 mg total) by mouth every 8 (eight) hours as needed for nausea or vomiting., Disp: 20 tablet, Rfl: 0   Medications ordered in this encounter:  Meds ordered this encounter  Medications   nirmatrelvir/ritonavir (PAXLOVID) 20 x 150 MG & 10 x 100MG  TABS    Sig: Take 3 tablets by mouth 2 (two) times daily for 5 days. (Take nirmatrelvir 150  mg two tablets twice daily for 5 days and ritonavir 100 mg one tablet twice daily for 5 days) Patient GFR is 132    Dispense:  30 tablet    Refill:  0    Supervising Provider:   Merrilee Jansky [[id number]]   promethazine-dextromethorphan (PROMETHAZINE-DM) 6.25-15 MG/5ML syrup    Sig: Take 5 mLs by mouth 4 (four) times daily as needed.    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    Dispense:  8 g    Refill:  0    Supervising Provider:   Merrilee Jansky [[id number]]     *If you need refills on other medications prior to your next appointment, please contact your pharmacy*  Follow-Up: Call back or seek an in-person evaluation if the symptoms worsen or if the condition fails to improve as anticipated.  [facility] [phone]  Care Instructions: Can take to lessen severity: Vit C 500mg  twice daily Quercertin 250-500mg  twice daily Zinc 75-100mg  daily Melatonin 3-6 mg at bedtime Vit D3 1000-2000 IU daily Aspirin 81 mg daily with food Optional: Famotidine 20mg  daily Also can add tylenol/ibuprofen as needed for fevers  and body aches May add Mucinex or Mucinex DM as needed for cough/congestion    Isolation Instructions: You are to isolate at home until you have been fever free for at least 24 hours without a fever-reducing medication, and symptoms have been steadily improving for 24 hours. At that time,  you can end isolation but need to mask for an additional 5 days.   If you must be around other household members who do not have symptoms, you need to make sure that both you and the family members are masking consistently with a high-quality mask.  If you note any worsening of symptoms despite treatment, please seek an in-person evaluation  ASAP. If you note any significant shortness of breath or any chest pain, please seek ER evaluation. Please do not delay care!   COVID-19: What to Do if You Are Sick If you test positive and are an older adult or someone who is at high risk of getting very sick from COVID-19, treatment may be available. Contact a healthcare provider right away after a positive test to determine if you are eligible, even if your symptoms are mild right now. You can also visit a Test to Treat location and, if eligible, receive a prescription from a provider. Don't delay: Treatment must be started within the first few days to be effective. If you have a fever, cough, or other symptoms, you might have COVID-19. Most people have mild illness and are able to recover at home. If you are sick: Keep track of your symptoms. If you have an emergency warning sign (including trouble breathing), call 911. Steps to help prevent the spread of COVID-19 if you are sick If you are sick with COVID-19 or think you might have COVID-19, follow the steps below to care for yourself and to help protect other people in your home and community. Stay home except to get medical care Stay home. Most people with COVID-19 have mild illness and can recover at home without medical care. Do not leave your home, except to get medical care. Do not visit public areas and do not go to places where you are unable to wear a mask. Take care of yourself. Get rest and stay hydrated. Take over-the-counter medicines, such as acetaminophen, to help you feel better. Stay in touch with your doctor. Call before you get medical care. Be sure to get care if you have trouble breathing, or have any other emergency warning signs, or if you think it is an emergency. Avoid public transportation, ride-sharing, or taxis if possible. Get tested If you have symptoms of COVID-19, get tested. While waiting for test results, stay away from others, including staying apart from those  living in your household. Get tested as soon as possible after your symptoms start. Treatments may be available for people with COVID-19 who are at risk for becoming very sick. Don't delay: Treatment must be started early to be effective--some treatments must begin within 5 days of your first symptoms. Contact your healthcare provider right away if your test result is positive to determine if you are eligible. Self-tests are one of several options for testing for the virus that causes COVID-19 and may be more convenient than laboratory-based tests and point-of-care tests. Ask your healthcare provider or your local health department if you need help interpreting your test results. You can visit your state, tribal, local, and territorial health department's website to look for the latest local information on testing sites. Separate yourself from other people As much  as possible, stay in a specific room and away from other people and pets in your home. If possible, you should use a separate bathroom. If you need to be around other people or animals in or outside of the home, wear a well-fitting mask. Tell your close contacts that they may have been exposed to COVID-19. An infected person can spread COVID-19 starting 48 hours (or 2 days) before the person has any symptoms or tests positive. By letting your close contacts know they may have been exposed to COVID-19, you are helping to protect everyone. See COVID-19 and Animals if you have questions about pets. If you are diagnosed with COVID-19, someone from the health department may call you. Answer the call to slow the spread. Monitor your symptoms Symptoms of COVID-19 include fever, cough, or other symptoms. Follow care instructions from your healthcare provider and local health department. Your local health authorities may give instructions on checking your symptoms and reporting information. When to seek emergency medical attention Look for emergency  warning signs* for COVID-19. If someone is showing any of these signs, seek emergency medical care immediately: Trouble breathing Persistent pain or pressure in the chest New confusion Inability to wake or stay awake Pale, gray, or blue-colored skin, lips, or nail beds, depending on skin tone *This list is not all possible symptoms. Please call your medical provider for any other symptoms that are severe or concerning to you. Call 911 or call ahead to your local emergency facility: Notify the operator that you are seeking care for someone who has or may have COVID-19. Call ahead before visiting your doctor Call ahead. Many medical visits for routine care are being postponed or done by phone or telemedicine. If you have a medical appointment that cannot be postponed, call your doctor's office, and tell them you have or may have COVID-19. This will help the office protect themselves and other patients. If you are sick, wear a well-fitting mask You should wear a mask if you must be around other people or animals, including pets (even at home). Wear a mask with the best fit, protection, and comfort for you. You don't need to wear the mask if you are alone. If you can't put on a mask (because of trouble breathing, for example), cover your coughs and sneezes in some other way. Try to stay at least 6 feet away from other people. This will help protect the people around you. Masks should not be placed on young children under age 64 years, anyone who has trouble breathing, or anyone who is not able to remove the mask without help. Cover your coughs and sneezes Cover your mouth and nose with a tissue when you cough or sneeze. Throw away used tissues in a lined trash can. Immediately wash your hands with soap and water for at least 20 seconds. If soap and water are not available, clean your hands with an alcohol-based hand sanitizer that contains at least 60% alcohol. Clean your hands often Wash your hands  often with soap and water for at least 20 seconds. This is especially important after blowing your nose, coughing, or sneezing; going to the bathroom; and before eating or preparing food. Use hand sanitizer if soap and water are not available. Use an alcohol-based hand sanitizer with at least 60% alcohol, covering all surfaces of your hands and rubbing them together until they feel dry. Soap and water are the best option, especially if hands are visibly dirty. Avoid touching your eyes, nose, and  mouth with unwashed hands. Handwashing Tips Avoid sharing personal household items Do not share dishes, drinking glasses, cups, eating utensils, towels, or bedding with other people in your home. Wash these items thoroughly after using them with soap and water or put in the dishwasher. Clean surfaces in your home regularly Clean and disinfect high-touch surfaces (for example, doorknobs, tables, handles, light switches, and countertops) in your "sick room" and bathroom. In shared spaces, you should clean and disinfect surfaces and items after each use by the person who is ill. If you are sick and cannot clean, a caregiver or other person should only clean and disinfect the area around you (such as your bedroom and bathroom) on an as needed basis. Your caregiver/other person should wait as long as possible (at least several hours) and wear a mask before entering, cleaning, and disinfecting shared spaces that you use. Clean and disinfect areas that may have blood, stool, or body fluids on them. Use household cleaners and disinfectants. Clean visible dirty surfaces with household cleaners containing soap or detergent. Then, use a household disinfectant. Use a product from [employer] List N: Disinfectants for Coronavirus (COVID-19). Be sure to follow the instructions on the label to ensure safe and effective use of the product. Many products recommend keeping the surface wet with a disinfectant for a certain period of  time (look at "contact time" on the product label). You may also need to wear personal protective equipment, such as gloves, depending on the directions on the product label. Immediately after disinfecting, wash your hands with soap and water for 20 seconds. For completed guidance on cleaning and disinfecting your home, visit Complete Disinfection Guidance. Take steps to improve ventilation at home Improve ventilation (air flow) at home to help prevent from spreading COVID-19 to other people in your household. Clear out COVID-19 virus particles in the air by opening windows, using air filters, and turning on fans in your home. Use this interactive tool to learn how to improve air flow in your home. When you can be around others after being sick with COVID-19 Deciding when you can be around others is different for different situations. Find out when you can safely end home isolation. For any additional questions about your care, contact your healthcare provider or state or local health department. 08/08/2020 Content source: [facility] for Immunization and Respiratory Diseases ([facility]), Division of Viral Diseases This information is not intended to replace advice given to you by your health care provider. Make sure you discuss any questions you have with your health care provider. Document Revised: 09/21/2020 Document Reviewed: 09/21/2020 Elsevier Patient Education  2022 [employer].     If you have been instructed to have an in-person evaluation today at a local Urgent Care facility, please use the link below. It will take you to a list of all of our available [facility], including address, phone number and hours of operation. Please do not delay care.  [facility]  If you or a family member do not have a primary care provider, use the link below to schedule a visit and establish care. When you choose a [facility] primary care physician or advanced practice  provider, you gain a long-term partner in health. Find a Primary Care Provider  Learn more about [facility]'s in-office and virtual care options: [facility] - Get Care Now

## 2023-07-28 NOTE — Progress Notes (Signed)
 Virtual Visit Consent   Valerie Hayes, you are scheduled for a virtual visit with a [facility] provider today. Just as with appointments in the office, your consent must be obtained to participate. Your consent will be active for this visit and any virtual visit you may have with one of our providers in the next 365 days. If you have a MyChart account, a copy of this consent can be sent to you electronically.  As this is a virtual visit, video technology does not allow for your provider to perform a traditional examination. This may limit your provider's ability to fully assess your condition. If your provider identifies any concerns that need to be evaluated in person or the need to arrange testing (such as labs, EKG, etc.), we will make arrangements to do so. Although advances in technology are sophisticated, we cannot ensure that it will always work on either your end or our end. If the connection with a video visit is poor, the visit may have to be switched to a telephone visit. With either a video or telephone visit, we are not always able to ensure that we have a secure connection.  By engaging in this virtual visit, you consent to the provision of healthcare and authorize for your insurance to be billed (if applicable) for the services provided during this visit. Depending on your insurance coverage, you may receive a charge related to this service.  I need to obtain your verbal consent now. Are you willing to proceed with your visit today? SHARALEE WITMAN has provided verbal consent on 07/28/2023 for a virtual visit (video or telephone). [name], PA-C  Date: 07/28/2023 9:10 AM   Virtual Visit via Video Note   I, [name], connected with  Valerie Hayes  ([id number], 10-11-2000) on 07/28/23 at  9:00 AM EDT by a video-enabled telemedicine application and verified that I am speaking with the correct person using two identifiers.  Location: Patient: Virtual Visit  Location Patient: Home Provider: Virtual Visit Location Provider: Home Office   I discussed the limitations of evaluation and management by telemedicine and the availability of in person appointments. The patient expressed understanding and agreed to proceed.    History of Present Illness: Valerie Hayes is a 23 y.o. who identifies as a female who was assigned female at birth, and is being seen today for Covid 30.  HPI: URI  This is a new problem. The current episode started yesterday. Maximum temperature: chills and sweats; temp not checked. The fever has been present for Less than 1 day. Associated symptoms include congestion, coughing (dry), headaches, a plugged ear sensation (popping), rhinorrhea, sinus pain and a sore throat. Pertinent negatives include no chest pain, diarrhea, ear pain, nausea, vomiting or wheezing. Treatments tried: mucinex, sudafed, dayquil/nyquil. The treatment provided no relief.     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   Class 2 severe obesity with serious comorbidity in adult (HCC) 10/29/2021   Weight gain 10/29/2021   Insulin resistance 10/29/2021   Essential (primary) hypertension 09/07/2021   Chronic tension-type headache, intractable 09/07/2021   Seasonal allergic rhinitis 09/07/2021   Anxiety, generalized 09/07/2021   Migraine with aura and without status migrainosus, not intractable 07/30/2021   PCOS (polycystic ovarian syndrome) 07/30/2021   Insomnia secondary to depression with anxiety 07/30/2021    Allergies:  Allergies  Allergen  Reactions   Yellow Dyes (Non-Tartrazine) Anaphylaxis   Metformin And Related Nausea And Vomiting   Fexofenadine-Pseudoephed Er Nausea And Vomiting    All allergy medications cause nausea and vomiting   Medications:  Current Outpatient Medications:    albuterol (VENTOLIN HFA) 108 (90  Base) MCG/ACT inhaler, Inhale 1-2 puffs into the lungs every 6 (six) hours as needed., Disp: 8 g, Rfl: 0   nirmatrelvir/ritonavir (PAXLOVID) 20 x 150 MG & 10 x 100MG  TABS, Take 3 tablets by mouth 2 (two) times daily for 5 days. (Take nirmatrelvir 150 mg two tablets twice daily for 5 days and ritonavir 100 mg one tablet twice daily for 5 days) Patient GFR is 132, Disp: 30 tablet, Rfl: 0   promethazine-dextromethorphan (PROMETHAZINE-DM) 6.25-15 MG/5ML syrup, Take 5 mLs by mouth 4 (four) times daily as needed., Disp: 118 mL, Rfl: 0   baclofen (LIORESAL) 10 MG tablet, Take by mouth., Disp: , Rfl:    EPINEPHrine 0.3 mg/0.3 mL IJ SOAJ injection, Inject 0.3 mg into the muscle as needed for anaphylaxis., Disp: 1 each, Rfl: 0   ketorolac (TORADOL) 10 MG tablet, Take by mouth., Disp: , Rfl:    promethazine (PHENERGAN) 25 MG tablet, Take 1 tablet (25 mg total) by mouth every 8 (eight) hours as needed for nausea or vomiting., Disp: 20 tablet, Rfl: 0  Observations/Objective: Patient is well-developed, well-nourished in no acute distress.  Resting comfortably at home.  Head is normocephalic, atraumatic.  No labored breathing.  Speech is clear and coherent with logical content.  Patient is alert and oriented at baseline.    Assessment and Plan: 1. COVID-19 (Primary) - nirmatrelvir/ritonavir (PAXLOVID) 20 x 150 MG & 10 x 100MG  TABS; Take 3 tablets by mouth 2 (two) times daily for 5 days. (Take nirmatrelvir 150 mg two tablets twice daily for 5 days and ritonavir 100 mg one tablet twice daily for 5 days) Patient GFR is 132  Dispense: 30 tablet; Refill: 0 - MyChart COVID-19 home monitoring program; Future - promethazine-dextromethorphan (PROMETHAZINE-DM) 6.25-15 MG/5ML syrup; Take 5 mLs by mouth 4 (four) times daily as needed.  Dispense: 118 mL; Refill: 0 - albuterol (VENTOLIN HFA) 108 (90 Base) MCG/ACT inhaler; Inhale 1-2 puffs into the lungs every 6 (six) hours as needed.  Dispense: 8 g; Refill: 0  - Continue  OTC symptomatic management of choice - Will send OTC vitamins and supplement information through AVS - Paxlovid prescribed - Promethazine DM and Albuterol also prescribed for better symptom control - Patient enrolled in MyChart symptom monitoring - Push fluids - Rest as needed - Discussed return precautions and when to seek in-person evaluation, sent via AVS as well   Follow Up Instructions: I discussed the assessment and treatment plan with the patient. The patient was provided an opportunity to ask questions and all were answered. The patient agreed with the plan and demonstrated an understanding of the instructions.  A copy of instructions were sent to the patient via MyChart unless otherwise noted below.    The patient was advised to call back or seek an in-person evaluation if the symptoms worsen or if the condition fails to improve as anticipated.    [name], PA-C

## 2023-07-28 NOTE — Telephone Encounter (Signed)
 Sent care advise for worse symptoms of cough and weakness to mychart.

## 2023-09-08 ENCOUNTER — Encounter: Payer: Self-pay | Admitting: Family Medicine

## 2023-09-08 ENCOUNTER — Ambulatory Visit: Admitting: Family Medicine

## 2023-09-08 VITALS — BP 132/78 | HR 72 | Ht 64.0 in | Wt 208.8 lb

## 2023-09-08 DIAGNOSIS — Z30430 Encounter for insertion of intrauterine contraceptive device: Secondary | ICD-10-CM | POA: Diagnosis not present

## 2023-09-08 DIAGNOSIS — Z124 Encounter for screening for malignant neoplasm of cervix: Secondary | ICD-10-CM

## 2023-09-08 DIAGNOSIS — Z3202 Encounter for pregnancy test, result negative: Secondary | ICD-10-CM | POA: Diagnosis not present

## 2023-09-08 DIAGNOSIS — Z309 Encounter for contraceptive management, unspecified: Secondary | ICD-10-CM | POA: Diagnosis not present

## 2023-09-08 DIAGNOSIS — Z30014 Encounter for initial prescription of intrauterine contraceptive device: Secondary | ICD-10-CM

## 2023-09-08 DIAGNOSIS — Z113 Encounter for screening for infections with a predominantly sexual mode of transmission: Secondary | ICD-10-CM

## 2023-09-08 LAB — PREGNANCY, URINE: Preg Test, Ur: NEGATIVE

## 2023-09-08 LAB — WET PREP FOR TRICH, YEAST, CLUE
Trichomonas Exam: NEGATIVE
Yeast Exam: NEGATIVE

## 2023-09-08 LAB — HM HIV SCREENING LAB: HM HIV Screening: NEGATIVE

## 2023-09-08 MED ORDER — PARAGARD INTRAUTERINE COPPER IU IUD
1.0000 | INTRAUTERINE_SYSTEM | Freq: Once | INTRAUTERINE | Status: AC
  Administered 2023-09-08: 1 via INTRAUTERINE

## 2023-09-08 NOTE — Progress Notes (Signed)
 Duplicate encounter.   Tempie Fee, MD 09/08/23  3:24 PM

## 2023-09-08 NOTE — Patient Instructions (Signed)
 IUD AFTERCARE  1.  Uterine cramping is common after IUD placement.  You may using heating pads, ibuprofen, and tylenol.  If it becomes very painful, you notice fever or chills, unusual bleeding, or foul smelling vaginal discharge please call the office. 2.  Irregular vaginal bleeding is common in the first few months after IUD placement but will often get better in the first six months.   3.  IUDs do not protect against STD such as HIV, genital warts, gonorrhea, chlamydia, herpes. Please continue to protect yourself against these infections and contact the office if you think you may have contracted an infection. 4.  Checking for proper placement:  You may feel for your IUD strings as shown today by placing you fingers into your vagina.  Do not pull on the strings.  If your IUD falls out, you can feel the hard plastic part of the IUD, or you can no longer feel the strings, please contact the office. 5.  Please see package insert or [email or web address] for full patient information. 6.  Make follow-up appointment for 4 weeks.  PAP Smear Today we performed a PAP smear to screen for cervical cancer. The results should be back in 1-2 weeks.  Once we have the results we can determine when your next screening should be.    STI screening - Today we obtained a vaginal swab to screen for gonorrhea, chlamydia, and trichomonas - We also obtained a blood sample to screen for HIV and syphilis - If the results are abnormal, I will give you a call.    Estimated time frame for results collected at the [facility] Department: Same day Trichomonas Yeast BV (bacterial vaginosis)  Within 1-2 weeks Gonorrhea Chlamydia  Within 2-3 weeks HIV Syphilis Hepatitis B Hepatitis C

## 2023-09-08 NOTE — Addendum Note (Signed)
 Addended by: Jakory Matsuo L on: 09/08/2023 06:25 PM   Modules accepted: Orders

## 2023-09-08 NOTE — Progress Notes (Signed)
 [employer] HEALTH DEPARTMENT [facility] [address], [address] [place] [address] Main phone: [phone]  Family Planning Visit - Initial Visit  Subjective:  Valerie Hayes is a 23 y.o.  G1P0010   being seen today for an initial annual visit and to discuss reproductive life planning.  The patient is currently using female condom for pregnancy prevention. Patient does not want a pregnancy in the next year.   Patient reports they are looking for a method with the following characteristics:  High efficacy at preventing pregnancy Discrete method Long term method  Patient has the following medical conditions: Patient Active Problem List   Diagnosis Date Noted   Muscle spasm 12/04/2021   Neck injury, initial encounter 12/04/2021   Anaphylactic syndrome 12/04/2021   Class 2 severe obesity with serious comorbidity in adult (HCC) 10/29/2021   Weight gain 10/29/2021   Insulin resistance 10/29/2021   Essential (primary) hypertension 09/07/2021   Chronic tension-type headache, intractable 09/07/2021   Seasonal allergic rhinitis 09/07/2021   Anxiety, generalized 09/07/2021   Migraine with aura and without status migrainosus, not intractable 07/30/2021   PCOS (polycystic ovarian syndrome) 07/30/2021   Insomnia secondary to depression with anxiety 07/30/2021   Chief Complaint  Patient presents with   Contraception   Annual Exam   HPI Patient reports she desires a physical exam and IUD placement. Currently using condoms for contraception. Discussed Liletta vs Paraguard and patient prefers Paraguard copper  IUD.   Patient denies fever, chills, lymphadenopathy, rashes, lesions, sores, discharge, odor, or pelvic/abdominal pain.    Vapes nicotine - one disposable cartridge a week (approx 1 pack)  Review of Systems  Constitutional:  Negative for fever, malaise/fatigue and weight loss.  Respiratory:  Negative for shortness of breath.   Cardiovascular:   Negative for chest pain and palpitations.   Diabetes screening This patient is 23 y.o. with a BMI of Body mass index is 35.84 kg/m.Aaron Aas  Is patient eligible for diabetes screening (age >35 and BMI >25)?  no  Was Hgb A1c ordered? not applicable  STI screening Patient reports 2 of partners in last year.  Does this patient desire STI screening?  Yes  Hepatitis C screening Has patient been screened once for HCV in the past?  No  No results found for: "HCVAB"  Does the patient meet criteria for HCV testing? No   Hepatitis B screening Does the patient meet criteria for HBV testing? No  Cervical Cancer Screening  No Cervical Cancer Screening results to display.  Health Maintenance Due  Topic Date Due   CHLAMYDIA SCREENING  Never done   HIV Screening  Never done   Meningococcal B Vaccine (1 of 2 - Standard) Never done   Hepatitis C Screening  Never done   Cervical Cancer Screening (Pap smear)  Never done   DTaP/Tdap/Td (7 - Td or Tdap) 12/29/2022   COVID-19 Vaccine (6 - 2024-25 season) 01/19/2023   The following portions of the patient's history were reviewed and updated as appropriate: allergies, current medications, past family history, past medical history, past social history, past surgical history and problem list. Problem list updated.  See flowsheet for other program required questions.  Objective:   Vitals:   09/08/23 1322 09/08/23 1450  BP: (!) 144/105 132/78  Pulse: 72   Weight: 208 lb 12.8 oz (94.7 kg)   Height: 5\' 4"  (1.626 m)    Physical Exam Vitals and nursing note reviewed. Exam conducted with a chaperone present Lawrence Pretty, RN and  Melia Sprague, CNA).  Constitutional:      General: She is not in acute distress.    Appearance: Normal appearance. She is obese. She is not ill-appearing or toxic-appearing.  HENT:     Head: Normocephalic and atraumatic.     Mouth/Throat:     Mouth: Mucous membranes are moist.     Pharynx: Oropharynx is clear. No oropharyngeal exudate  or posterior oropharyngeal erythema.  Eyes:     General: No scleral icterus.       Right eye: No discharge.        Left eye: No discharge.     Conjunctiva/sclera: Conjunctivae normal.  Cardiovascular:     Rate and Rhythm: Normal rate and regular rhythm.     Pulses: Normal pulses.     Heart sounds: Normal heart sounds. No murmur heard.    No gallop.  Pulmonary:     Effort: Pulmonary effort is normal. No respiratory distress.     Breath sounds: No wheezing or rales.  Genitourinary:    General: Normal vulva.     Exam position: Lithotomy position.     Pubic Area: No rash or pubic lice.      Tanner stage (genital): 5.     Labia:        Right: No rash or lesion.        Left: No rash or lesion.      Vagina: Normal. No vaginal discharge, erythema, bleeding or lesions.     Cervix: No cervical motion tenderness, discharge, friability, lesion or erythema.     Uterus: Normal.      Comments: pH = 3 Musculoskeletal:     Cervical back: Neck supple. No rigidity or tenderness.  Lymphadenopathy:     Head:     Right side of head: No preauricular or posterior auricular adenopathy.     Left side of head: No preauricular or posterior auricular adenopathy.     Cervical: No cervical adenopathy.     Right cervical: No superficial or posterior cervical adenopathy.    Left cervical: No superficial or posterior cervical adenopathy.     Upper Body:     Right upper body: No supraclavicular or axillary adenopathy.     Left upper body: No supraclavicular or axillary adenopathy.  Skin:    General: Skin is warm and dry.     Capillary Refill: Capillary refill takes less than 2 seconds.     Findings: No rash.  Neurological:     General: No focal deficit present.     Mental Status: She is alert and oriented to person, place, and time.  Psychiatric:        Mood and Affect: Mood normal.        Behavior: Behavior normal.    Procedure:  Patient presented to [facility] for IUD insertion. Her GC/CT screening was  not up to date however she is asymptomatic. Will collect today. Using WHO criteria we can be reasonably certain she is not pregnant or a pregnancy test was obtained which was Urine pregnancy test  today was Negative.  See Flowsheet for IUD check list  IUD Insertion Procedure Note Patient identified, informed consent performed, consent signed.   Discussed risks of irregular bleeding, cramping, infection, malpositioning or misplacement of the IUD outside the uterus which may require further procedure such as laparoscopy. Time out was performed.    Speculum placed in the vagina.  Cervix visualized.  Cleaned with Betadine x 4.  Grasped anteriorly with a single tooth tenaculum.  Uterus sounded to 7 cm.  IUD placed per manufacturer's recommendations. Initial attempt did not result in deployment due to operator error with plunger apparatus. IUD insertion device was maintained in sterile fashion. Second attempt with same device successful. Strings trimmed to 3 cm. Tenaculum was removed, good hemostasis noted.  Patient tolerated procedure well.   Patient was given post-procedure instructions- both agency handout and verbally by provider.  She was advised to have backup contraception for one week.  Patient was also asked to check IUD strings periodically or follow up in 4 weeks for IUD check.  Assessment and Plan:  Valerie Hayes is a 23 y.o. female presenting to the [facility] Department for an initial annual wellness/contraceptive visit  Contraception counseling: Reviewed options based on patient desire and reproductive life plan. Patient is interested in IUD or IUS. This was provided to the patient today.   Risks, benefits, and typical effectiveness rates were reviewed.  Questions were answered.  Written information was also given to the patient to review.    The patient will follow up in  1 months for surveillance.  The patient was told to call with any further questions, or with any  concerns about this method of contraception.  Emphasized use of condoms 100% of the time for STI prevention.  Educated on ECP and assessed for need of ECP. Patient does not meet criteria.  Encounter for initial prescription of intrauterine contraceptive device (IUD) -     Pregnancy, urine -     Chlamydia/Gonorrhea [facility] -     WET PREP FOR TRICH, YEAST, CLUE -     Syphilis Serology, [place] [facility] -     HIV [place] [facility]  Screening examination for venereal disease -     Chlamydia/Gonorrhea [facility] -     WET PREP FOR TRICH, YEAST, CLUE -     Syphilis Serology, [place] [facility] -     HIV [place] [facility]  Cervical cancer screening -     IGP, rfx Aptima HPV ASCU   Return in about 4 weeks (around 10/06/2023) for IUD string check and follow up.  No future appointments.  Jack Marts, MD

## 2023-09-08 NOTE — Progress Notes (Signed)
 Pt here for yearly physical and insertion of IUD.  Wet mount results reviewed with patient.  No treatment needed at this time.  Urine pregnancy test negative.  Paraguard IUD inserted by Dr. Bohdan Bush without difficulty.  Pt. Tolerated well.  Condoms declined.  Family planning packet provided.  Pt to return to clinic in 4 weeks for IUD Kari Otto, RN

## 2023-09-09 ENCOUNTER — Ambulatory Visit

## 2023-09-16 LAB — GYN REPORT: PAP Smear Comment: 0

## 2023-09-19 ENCOUNTER — Encounter: Payer: Self-pay | Admitting: Family Medicine

## 2023-10-06 ENCOUNTER — Ambulatory Visit

## 2023-11-22 DIAGNOSIS — N946 Dysmenorrhea, unspecified: Secondary | ICD-10-CM | POA: Diagnosis not present

## 2023-11-22 DIAGNOSIS — Z888 Allergy status to other drugs, medicaments and biological substances status: Secondary | ICD-10-CM | POA: Diagnosis not present

## 2023-11-22 DIAGNOSIS — Z91041 Radiographic dye allergy status: Secondary | ICD-10-CM | POA: Diagnosis not present

## 2023-12-15 DIAGNOSIS — Z1331 Encounter for screening for depression: Secondary | ICD-10-CM | POA: Diagnosis not present

## 2023-12-15 DIAGNOSIS — L309 Dermatitis, unspecified: Secondary | ICD-10-CM | POA: Diagnosis not present

## 2024-01-12 ENCOUNTER — Ambulatory Visit (INDEPENDENT_AMBULATORY_CARE_PROVIDER_SITE_OTHER)

## 2024-01-12 DIAGNOSIS — L505 Cholinergic urticaria: Secondary | ICD-10-CM

## 2024-01-12 DIAGNOSIS — L249 Irritant contact dermatitis, unspecified cause: Secondary | ICD-10-CM

## 2024-01-12 DIAGNOSIS — L509 Urticaria, unspecified: Secondary | ICD-10-CM

## 2024-01-12 DIAGNOSIS — R21 Rash and other nonspecific skin eruption: Secondary | ICD-10-CM

## 2024-01-12 MED ORDER — TACROLIMUS 0.1 % EX OINT: TOPICAL_OINTMENT | CUTANEOUS | 5 refills | Status: AC

## 2024-01-12 NOTE — Patient Instructions (Addendum)
 Vanicream Antiperspirant Deodorant 2.  VMV Hypoallergenics~  Essence Skin-Saving Antiperspirant  3. Certain Dri~ Clinical Strength Roll-on Antiperspirant  4. ~Cleure~ Spray Deodorant; Roll-on Deodorant; Stick Deodorant  5. Almay roll-on deodorant (roll-on only)  6. Health visitor; Scientist, research (physical sciences)       Due to recent changes in healthcare laws, you may see results of your pathology and/or laboratory studies on MyChart before the doctors have had a chance to review them. We understand that in some cases there may be results that are confusing or concerning to you. Please understand that not all results are received at the same time and often the doctors may need to interpret multiple results in order to provide you with the best plan of care or course of treatment. Therefore, we ask that you please give us  2 business days to thoroughly review all your results before contacting the office for clarification. Should we see a critical lab result, you will be contacted sooner.   If You Need Anything After Your Visit  If you have any questions or concerns for your doctor, please call our main line at [phone] and press option 4 to reach your doctor's medical assistant. If no one answers, please leave a voicemail as directed and we will return your call as soon as possible. Messages left after 4 pm will be answered the following business day.   You may also send us  a message via MyChart. We typically respond to MyChart messages within 1-2 business days.  For prescription refills, please ask your pharmacy to contact our office. Our fax number is [phone].  If you have an urgent issue when the clinic is closed that cannot wait until the next business day, you can page your doctor at the number below.    Please note that while we do our best to be available for urgent issues outside of office hours, we are not available 24/7.   If you have an urgent issue and are  unable to reach us , you may choose to seek medical care at your doctor's office, retail clinic, urgent care center, or emergency room.  If you have a medical emergency, please immediately call 911 or go to the emergency department.  Pager Numbers  - Dr. Hester: [phone]  - Dr. Jackquline: [phone]  - Dr. Claudene: [phone]   - Dr. Raymund: [phone]  In the event of inclement weather, please call our main line at [phone] for an update on the status of any delays or closures.  Dermatology Medication Tips: Please keep the boxes that topical medications come in in order to help keep track of the instructions about where and how to use these. Pharmacies typically print the medication instructions only on the boxes and not directly on the medication tubes.   If your medication is too expensive, please contact our office at [phone] option 4 or send us  a message through MyChart.   We are unable to tell what your co-pay for medications will be in advance as this is different depending on your insurance coverage. However, we may be able to find a substitute medication at lower cost or fill out paperwork to get insurance to cover a needed medication.   If a prior authorization is required to get your medication covered by your insurance company, please allow us  1-2 business days to complete this process.  Drug prices often vary depending on where the prescription is filled and some pharmacies may offer cheaper prices.  The website www.goodrx.com  contains coupons for medications through different pharmacies. The prices here do not account for what the cost may be with help from insurance (it may be cheaper with your insurance), but the website can give you the price if you did not use any insurance.  - You can print the associated coupon and take it with your prescription to the pharmacy.  - You may also stop by our office during regular business hours and pick up a GoodRx coupon  card.  - If you need your prescription sent electronically to a different pharmacy, notify our office through [facility] or by phone at [phone] option 4.     Si Usted Necesita Algo Despus de Su Visita  Tambin puede enviarnos un mensaje a travs de Clinical cytogeneticist. Por lo general respondemos a los mensajes de MyChart en el transcurso de 1 a 2 das hbiles.  Para renovar recetas, por favor pida a su farmacia que se ponga en contacto con nuestra oficina. Randi lakes de fax es [place] [phone].  Si tiene un asunto urgente cuando la clnica est cerrada y que no puede esperar hasta el siguiente da hbil, puede llamar/localizar a su doctor(a) al nmero que aparece a continuacin.   Por favor, tenga en cuenta que aunque hacemos todo lo posible para estar disponibles para asuntos urgentes fuera del horario de [place], no estamos disponibles las 24 horas del da, los 7 [address]  [address] de la [place].   Si tiene un problema urgente y no puede comunicarse con nosotros, puede optar por buscar atencin mdica  en el consultorio de su doctor(a), en una clnica privada, en un centro de atencin urgente o en una sala de emergencias.  Si tiene Engineer, drilling, por favor llame inmediatamente al 911 o vaya a la sala de emergencias.  Nmeros de bper  - Dr. Hester: [phone]  - Dra. Jackquline: [phone]  - Dr. Claudene: [phone]  - Dra. Kitts: [phone]  En caso de inclemencias del [place], por favor llame a nuestra lnea principal al [phone] para una actualizacin sobre el estado de cualquier retraso o cierre.  Consejos para la medicacin en dermatologa: Por favor, guarde las cajas en las que vienen los medicamentos de uso tpico para ayudarle a seguir las instrucciones sobre dnde y cmo usarlos. Las farmacias generalmente imprimen las instrucciones del medicamento slo en las cajas y no directamente en los tubos del [place].   Si su medicamento es muy caro, por favor, pngase  en contacto con landry rieger llamando al [phone] y presione la opcin 4 o envenos un mensaje a travs de Clinical cytogeneticist.   No podemos decirle cul ser su copago por los medicamentos por adelantado ya que esto es diferente dependiendo de la cobertura de su seguro. Sin embargo, es posible que podamos encontrar un medicamento sustituto a Audiological scientist un formulario para que el seguro cubra el medicamento que se considera necesario.   Si se requiere una autorizacin previa para que su compaa de seguros malta su medicamento, por favor permtanos de 1 a 2 das hbiles para completar este proceso.  Los precios de los medicamentos varan con frecuencia dependiendo del Environmental consultant de dnde se surte la receta y alguna farmacias pueden ofrecer precios ms baratos.  El sitio web www.goodrx.com tiene cupones para medicamentos de Health and safety inspector. Los precios aqu no tienen en cuenta lo que podra costar con la ayuda del seguro (puede ser ms barato con su seguro), pero el sitio web puede darle el precio si no utiliz Tourist information centre manager.  -  Puede imprimir el cupn correspondiente y llevarlo con su receta a la farmacia.  - Tambin puede pasar por nuestra oficina durante el horario de atencin regular y Education officer, museum una tarjeta de cupones de GoodRx.  - Si necesita que su receta se enve electrnicamente a una farmacia diferente, informe a nuestra oficina a travs de MyChart de [place] o por telfono llamando al [phone] y presione la opcin 4.

## 2024-01-12 NOTE — Progress Notes (Signed)
   New Patient Visit   Subjective  Valerie Hayes is a 23 y.o. female who presents for the following: Rash  Intermittent rash mostly on chest and back; has been over her whole body once about 3 months ago. Patient states that rash is itchy not painful. Has tried OTC benadryl  w/o relief. Was given oral steroid by urgent care about 2 weeks ago that did completely clear the rash. Patient denies changing laundry detergent, soap, shampoo, lotions. She denied medication and environmental changes as well. Patient does not have a rash at this time.   Patient is unaware of any triggers. She states that the rash typically flares one to two times weekly.   The following portions of the chart were reviewed this encounter and updated as appropriate: medications, allergies, medical history  Review of Systems:  No other skin or systemic complaints except as noted in HPI or Assessment and Plan.  Objective  Well appearing patient in no apparent distress; mood and affect are within normal limits.  A focused examination was performed of the following areas: Back, upper extremities, axilla - back clear - axilla with pink scaly plaques   Relevant exam findings are noted in the Assessment and Plan.    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 Condition is symptomatic and bothersome to patient. Patient is flaring and not currently at treatment goal.  - Explained the process of cholinergic urticaria that can be associated with overheating, overstimulation - Initiating cetirizine 10 mg (OTC) 1-3 pills daily as tolerated.   - Advised can use up to 4x the recommended dose of antihistamines - Stressed the importance of taking these pills even when not itching.  - Encouraged avoidance of exacerbating factors (e.g. 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 Given location of eruption, unsafe to use daily topical CS to area. Patient requires topical calcineurin inhibitor given high risk of dyspigmentation and atrophy from topical CS use in sensitive area. - provided list of hypoallergenic deodorants      Return in about 6 weeks (around 02/23/2024).  I, Emerick Ege, CMA am acting as scribe for Lauraine JAYSON Kanaris, MD.   Documentation: I have reviewed the above documentation for accuracy and completeness, and I agree with the above.  Lauraine JAYSON Kanaris, MD

## 2024-03-01 ENCOUNTER — Ambulatory Visit

## 2024-03-02 ENCOUNTER — Ambulatory Visit
Admission: EM | Admit: 2024-03-02 | Discharge: 2024-03-02 | Disposition: A | Discharge status: Home / Self Care | Attending: Emergency Medicine | Admitting: Emergency Medicine

## 2024-03-02 DIAGNOSIS — J0100 Acute maxillary sinusitis, unspecified: Secondary | ICD-10-CM

## 2024-03-02 MED ORDER — AMOXICILLIN-POT CLAVULANATE 875-125 MG PO TABS
1.0000 | ORAL_TABLET | Freq: Two times a day (BID) | ORAL | 0 refills | Status: AC
Start: 2024-03-02 — End: ?

## 2024-03-02 NOTE — ED Provider Notes (Signed)
 CAY RALPH PELT    CSN: [id number] Arrival date & time: 03/02/24  1126      History   Chief Complaint Chief Complaint  Patient presents with   Nasal Congestion   Cough    HPI Valerie Hayes is a 23 y.o. female.  Patient presents with 1 week history of congestion, sinus pressure, postnasal drip, ear pain, sore throat, cough.  No fever, shortness of breath, vomiting, diarrhea.  She has been treating her symptoms with DayQuil and NyQuil.  The history is provided by the patient and medical records.    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   Class 2 severe obesity with serious comorbidity in adult 10/29/2021   Weight gain 10/29/2021   Insulin resistance 10/29/2021   Essential (primary) hypertension 09/07/2021   Chronic tension-type headache, intractable 09/07/2021   Seasonal allergic rhinitis 09/07/2021   Anxiety, generalized 09/07/2021   Migraine with aura and without status migrainosus, not intractable 07/30/2021   PCOS (polycystic ovarian syndrome) 07/30/2021   Insomnia secondary to depression with anxiety 07/30/2021    Past Surgical History:  Procedure Laterality Date   LIPOMA RESECTION     MOUTH SURGERY     expose a teeth that would'nt come down   TONSILLECTOMY AND ADENOIDECTOMY      OB History     Gravida  1   Para  0   Term  0   Preterm  0   AB  1   Living  0      SAB  1   IAB  0   Ectopic  0   Multiple  0   Live Births  0            Home Medications    Prior to Admission medications   Medication Sig Start Date End Date Taking? Authorizing Provider  amoxicillin -clavulanate (AUGMENTIN ) 875-125 MG tablet Take 1 tablet by mouth every 12 (twelve) hours. 03/02/24  Yes Corlis Burnard DEL,  NP  albuterol  (VENTOLIN  HFA) 108 (90 Base) MCG/ACT inhaler Inhale 1-2 puffs into the lungs every 6 (six) hours as needed. Patient not taking: Reported on 09/08/2023 07/28/23   Vivienne Delon HERO, PA-C  baclofen (LIORESAL) 10 MG tablet Take by mouth. Patient not taking: Reported on 09/08/2023 12/17/22   [provider]  EPINEPHrine  0.3 mg/0.3 mL IJ SOAJ injection Inject 0.3 mg into the muscle as needed for anaphylaxis. 12/04/21   Dugal, Tabitha, FNP  ketorolac  (TORADOL ) 10 MG tablet Take by mouth. Patient not taking: Reported on 09/08/2023 12/17/22   [provider]  promethazine  (PHENERGAN ) 25 MG tablet Take 1 tablet (25 mg total) by mouth every 8 (eight) hours as needed for nausea or vomiting. Patient not taking: Reported on 09/08/2023 04/04/23   Blair, Diane W, FNP  promethazine -dextromethorphan (PROMETHAZINE -DM) 6.25-15 MG/5ML syrup Take 5 mLs by mouth 4 (four) times daily as needed. Patient not taking: Reported on 09/08/2023 07/28/23   Vivienne Delon HERO, PA-C  tacrolimus  (PROTOPIC ) 0.1 % ointment Apply 2 grams twice daily to affected areas of skin 01/12/24   Raymund Lauraine BROCKS, MD    Family History Family History  Problem Relation Age of Onset   Heart disease Mother    Diabetes Father  Left bundle branch blockage   Other Sister        pituitary gland tumor - benign   Diabetes Maternal Grandmother    Cancer Maternal Grandmother        colon/rectal; uterine 21yrs ago   Heart disease Maternal Grandfather    Cancer Maternal Grandfather        colon   Diabetes Paternal Grandmother    Cancer Paternal Grandmother        colon/rectal   Diabetes Paternal Grandfather    Heart disease Paternal Grandfather     Social History Social History   Tobacco Use   Smoking status: Never   Smokeless tobacco: Never  Vaping Use   Vaping status: Some Days   Substances: Nicotine, Flavoring  Substance Use Topics   Alcohol use: Yes    Alcohol/week: 1.0 standard drink of alcohol     Types: 1 Glasses of wine per week    Comment: weekly-mostly on weekends   Drug use: Never     Allergies   Yellow dyes (non-tartrazine), Metformin and related, and Fexofenadine-pseudoephed er   Review of Systems Review of Systems  Constitutional:  Negative for chills and fever.  HENT:  Positive for congestion, ear pain, postnasal drip, rhinorrhea and sore throat.   Respiratory:  Positive for cough. Negative for shortness of breath.      Physical Exam Triage Vital Signs ED Triage Vitals  Encounter Vitals Group     BP 03/02/24 1216 128/84     Girls Systolic BP Percentile --      Girls Diastolic BP Percentile --      Boys Systolic BP Percentile --      Boys Diastolic BP Percentile --      Pulse Rate 03/02/24 1216 79     Resp 03/02/24 1216 18     Temp 03/02/24 1216 97.6 F (36.4 C)     Temp src --      SpO2 03/02/24 1216 97 %     Weight --      Height --      Head Circumference --      Peak Flow --      Pain Score 03/02/24 1219 5     Pain Loc --      Pain Education --      Exclude from Growth Chart --    No data found.  Updated Vital Signs BP 128/84   Pulse 79   Temp 97.6 F (36.4 C)   Resp 18   SpO2 97%   Visual Acuity Right Eye Distance:   Left Eye Distance:   Bilateral Distance:    Right Eye Near:   Left Eye Near:    Bilateral Near:     Physical Exam Constitutional:      General: She is not in acute distress. HENT:     Right Ear: Tympanic membrane normal.     Left Ear: Tympanic membrane normal.     Nose: Congestion and rhinorrhea present.     Mouth/Throat:     Mouth: Mucous membranes are moist.     Pharynx: Oropharynx is clear.  Cardiovascular:     Rate and Rhythm: Normal rate and regular rhythm.     Heart sounds: Normal heart sounds.  Pulmonary:     Effort: Pulmonary effort is normal. No respiratory distress.     Breath sounds: Normal breath sounds.  Neurological:     Mental Status: She is alert.      UC Treatments / Results  Labs (all labs ordered are listed, but only abnormal results are displayed) Labs Reviewed - No data to display  EKG   Radiology No results found.  Procedures Procedures (including critical care time)  Medications Ordered in UC Medications - No data to display  Initial Impression / Assessment and Plan / UC Course  I have reviewed the triage vital signs and the nursing notes.  Pertinent labs & imaging results that were available during my care of the patient were reviewed by me and considered in my medical decision making (see chart for details).    Acute sinusitis.  Afebrile and vital signs are stable.  Lungs are clear and O2 sat is 97% on room air.  Patient has been symptomatic for a week and is not improving with OTC medication.  Treating today with Augmentin  x 7-day course.  Continue DayQuil and NyQuil as needed.  Instructed her to follow-up with her PCP if she is not improving.  She agrees to plan of care.  Final Clinical Impressions(s) / UC Diagnoses   Final diagnoses:  Acute non-recurrent maxillary sinusitis     Discharge Instructions      Take the Augmentin  as directed.  Follow-up with your primary care provider if your symptoms are not improving.      ED Prescriptions     Medication Sig Dispense Auth. Provider   amoxicillin -clavulanate (AUGMENTIN ) 875-125 MG tablet Take 1 tablet by mouth every 12 (twelve) hours. 14 tablet Corlis Burnard DEL, NP      PDMP not reviewed this encounter.   Corlis Burnard DEL, NP 03/02/24 1252

## 2024-03-02 NOTE — Discharge Instructions (Addendum)
 Take the Augmentin as directed.  Follow up with your primary care provider if your symptoms are not improving.

## 2024-03-02 NOTE — ED Triage Notes (Signed)
 Patient to Urgent Care with complaints of cough/ nasal congestion/ sore throat/ ear fullness/ drainage.   Symptoms x1 week. Negative home flu/covid test.  Meds: nyquil/ dayquil

## 2024-05-10 DIAGNOSIS — R6889 Other general symptoms and signs: Secondary | ICD-10-CM | POA: Diagnosis not present

## 2024-05-10 DIAGNOSIS — Z1330 Encounter for screening examination for mental health and behavioral disorders, unspecified: Secondary | ICD-10-CM | POA: Diagnosis not present

## 2024-05-10 DIAGNOSIS — Z20828 Contact with and (suspected) exposure to other viral communicable diseases: Secondary | ICD-10-CM | POA: Diagnosis not present
